# Patient Record
Sex: Male | Born: 1935 | Race: White | Hispanic: No | Marital: Single | Smoking: Never smoker
Health system: Southern US, Community
[De-identification: ages and names within clinical notes are randomized; demographics above are authoritative.]

## PROBLEM LIST (undated history)

## (undated) DIAGNOSIS — F0390 Unspecified dementia without behavioral disturbance: Secondary | ICD-10-CM

## (undated) DIAGNOSIS — N390 Urinary tract infection, site not specified: Secondary | ICD-10-CM

## (undated) DIAGNOSIS — E079 Disorder of thyroid, unspecified: Secondary | ICD-10-CM

## (undated) DIAGNOSIS — E039 Hypothyroidism, unspecified: Secondary | ICD-10-CM

## (undated) DIAGNOSIS — I10 Essential (primary) hypertension: Secondary | ICD-10-CM

## (undated) DIAGNOSIS — I639 Cerebral infarction, unspecified: Secondary | ICD-10-CM

## (undated) DIAGNOSIS — M109 Gout, unspecified: Secondary | ICD-10-CM

## (undated) HISTORY — PX: EYE SURGERY: SHX253

## (undated) HISTORY — PX: COLON SURGERY: SHX602

## (undated) HISTORY — PX: SHOULDER SURGERY: SHX246

---

## 1998-05-04 ENCOUNTER — Ambulatory Visit (HOSPITAL_COMMUNITY): Admission: RE | Admit: 1998-05-04 | Discharge: 1998-05-04 | Payer: Self-pay | Admitting: Internal Medicine

## 1998-06-13 ENCOUNTER — Ambulatory Visit (HOSPITAL_COMMUNITY): Admission: RE | Admit: 1998-06-13 | Discharge: 1998-06-13 | Payer: Self-pay | Admitting: Gastroenterology

## 1998-06-30 ENCOUNTER — Inpatient Hospital Stay (HOSPITAL_COMMUNITY): Admission: RE | Admit: 1998-06-30 | Discharge: 1998-07-06 | Payer: Self-pay | Admitting: Surgery

## 1998-06-30 ENCOUNTER — Encounter: Payer: Self-pay | Admitting: Surgery

## 1998-07-03 ENCOUNTER — Encounter: Payer: Self-pay | Admitting: Surgery

## 1999-07-11 ENCOUNTER — Encounter: Payer: Self-pay | Admitting: Surgery

## 1999-07-16 ENCOUNTER — Encounter (INDEPENDENT_AMBULATORY_CARE_PROVIDER_SITE_OTHER): Payer: Self-pay

## 1999-07-16 ENCOUNTER — Inpatient Hospital Stay (HOSPITAL_COMMUNITY): Admission: RE | Admit: 1999-07-16 | Discharge: 1999-07-19 | Payer: Self-pay | Admitting: Surgery

## 1999-09-05 ENCOUNTER — Encounter: Payer: Self-pay | Admitting: Internal Medicine

## 1999-09-05 ENCOUNTER — Encounter: Admission: RE | Admit: 1999-09-05 | Discharge: 1999-09-05 | Payer: Self-pay | Admitting: Internal Medicine

## 2000-02-20 ENCOUNTER — Ambulatory Visit (HOSPITAL_COMMUNITY): Admission: RE | Admit: 2000-02-20 | Discharge: 2000-02-20 | Payer: Self-pay | Admitting: Gastroenterology

## 2000-02-20 ENCOUNTER — Encounter (INDEPENDENT_AMBULATORY_CARE_PROVIDER_SITE_OTHER): Payer: Self-pay | Admitting: Specialist

## 2001-03-24 ENCOUNTER — Encounter: Admission: RE | Admit: 2001-03-24 | Discharge: 2001-03-24 | Payer: Self-pay | Admitting: *Deleted

## 2001-03-24 ENCOUNTER — Encounter: Payer: Self-pay | Admitting: *Deleted

## 2001-03-26 ENCOUNTER — Ambulatory Visit (HOSPITAL_BASED_OUTPATIENT_CLINIC_OR_DEPARTMENT_OTHER): Admission: RE | Admit: 2001-03-26 | Discharge: 2001-03-27 | Payer: Self-pay | Admitting: *Deleted

## 2001-03-26 ENCOUNTER — Encounter (INDEPENDENT_AMBULATORY_CARE_PROVIDER_SITE_OTHER): Payer: Self-pay | Admitting: Specialist

## 2002-12-30 ENCOUNTER — Encounter: Admission: RE | Admit: 2002-12-30 | Discharge: 2002-12-30 | Payer: Self-pay | Admitting: Internal Medicine

## 2002-12-30 ENCOUNTER — Encounter: Payer: Self-pay | Admitting: Internal Medicine

## 2003-04-04 ENCOUNTER — Ambulatory Visit (HOSPITAL_COMMUNITY): Admission: RE | Admit: 2003-04-04 | Discharge: 2003-04-04 | Payer: Self-pay | Admitting: Gastroenterology

## 2003-04-04 ENCOUNTER — Encounter (INDEPENDENT_AMBULATORY_CARE_PROVIDER_SITE_OTHER): Payer: Self-pay | Admitting: Specialist

## 2004-09-18 ENCOUNTER — Encounter: Admission: RE | Admit: 2004-09-18 | Discharge: 2004-09-18 | Payer: Self-pay | Admitting: Internal Medicine

## 2006-08-13 ENCOUNTER — Encounter: Admission: RE | Admit: 2006-08-13 | Discharge: 2006-08-13 | Payer: Self-pay | Admitting: Internal Medicine

## 2008-05-18 ENCOUNTER — Encounter: Admission: RE | Admit: 2008-05-18 | Discharge: 2008-05-18 | Payer: Self-pay | Admitting: Neurology

## 2010-07-18 ENCOUNTER — Other Ambulatory Visit: Payer: Self-pay | Admitting: Gastroenterology

## 2010-10-05 ENCOUNTER — Emergency Department (HOSPITAL_COMMUNITY): Payer: Medicare Other

## 2010-10-05 ENCOUNTER — Emergency Department (HOSPITAL_COMMUNITY)
Admission: EM | Admit: 2010-10-05 | Discharge: 2010-10-06 | Disposition: A | Discharge status: Home / Self Care | Payer: Medicare Other | Attending: Emergency Medicine | Admitting: Emergency Medicine

## 2010-10-05 DIAGNOSIS — W1789XA Other fall from one level to another, initial encounter: Secondary | ICD-10-CM | POA: Insufficient documentation

## 2010-10-05 DIAGNOSIS — E039 Hypothyroidism, unspecified: Secondary | ICD-10-CM | POA: Insufficient documentation

## 2010-10-05 DIAGNOSIS — S42253A Displaced fracture of greater tuberosity of unspecified humerus, initial encounter for closed fracture: Secondary | ICD-10-CM | POA: Insufficient documentation

## 2010-10-05 DIAGNOSIS — Y92009 Unspecified place in unspecified non-institutional (private) residence as the place of occurrence of the external cause: Secondary | ICD-10-CM | POA: Insufficient documentation

## 2010-10-05 DIAGNOSIS — E119 Type 2 diabetes mellitus without complications: Secondary | ICD-10-CM | POA: Insufficient documentation

## 2010-10-05 DIAGNOSIS — M109 Gout, unspecified: Secondary | ICD-10-CM | POA: Insufficient documentation

## 2010-10-05 DIAGNOSIS — I10 Essential (primary) hypertension: Secondary | ICD-10-CM | POA: Insufficient documentation

## 2010-10-05 DIAGNOSIS — Y998 Other external cause status: Secondary | ICD-10-CM | POA: Insufficient documentation

## 2010-10-05 DIAGNOSIS — Z79899 Other long term (current) drug therapy: Secondary | ICD-10-CM | POA: Insufficient documentation

## 2010-10-19 NOTE — Op Note (Signed)
NAME:  Donald Mcconnell, Donald Mcconnell                           ACCOUNT NO.:  [id number]   MEDICAL RECORD NO.:  [id number]                   PATIENT TYPE:  AMB   LOCATION:  ENDO                                 FACILITY:  [facility]   PHYSICIAN:  Petra Kuba, M.D.                 DATE OF BIRTH:  1928-10-16   DATE OF PROCEDURE:  04/04/2003  DATE OF DISCHARGE:                                 OPERATIVE REPORT   PROCEDURE:  Colonoscopy.   ENDOSCOPIST:  Petra Kuba, M.D.   INDICATION:  Patient with colon polyps; one was a large villous adenoma  requiring a low anterior resection, due for a repeat screening.   INFORMED CONSENT:  Consent was signed after risks, benefits, methods and  options thoroughly discussed in the office on multiple occasions.   MEDICATIONS USED:  Demerol 50 mg, Versed 5 mg.   PROCEDURE:  Rectal inspection was pertinent for external hemorrhoids.  No  obvious rash was seen.  A video pediatric adjustable colonoscope was  inserted, easily advanced around the anastomosis into the cecum; no obvious  abnormality was seen on insertion.  No abdominal pressure or position  changes were required.  The cecum was identified by the appendiceal orifice  and the ileocecal valve.  Scope was slowly withdrawn.  The prep was  adequate.  There was some liquid stool that required washing and suctioning.  On slow withdrawal through the colon, cecum, ascending, transverse and  descending were normal.  Along the anastomosis, back in the proximal rectum,  possibly a tiny polyp was seen and was cold-biopsied x4.  Retroflexion and  anorectal pull-through confirmed some small hemorrhoids.  The scope was  reinserted and at the proximal level of the splenic flexure, a tiny polyp  was seen and was cold-biopsied x2, put in a separate container.  Scope was  further advanced up the right side of the colon, air was suctioned and scope  removed.  The patient tolerated the procedure well.  There were no obvious  immediate complications.   ENDOSCOPIC DIAGNOSES:  1. Internal and external hemorrhoids.  2. Low anterior resection, status post biopsy of a questionable tiny polyp.  3. Questionable descending polyp, cold-biopsied as well.  4. Otherwise within normal limits to the cecum.    PLAN:  Await pathology to determine future colonic screening, although the  patient had other medical problems and question whether that is indicated.  Happy to see back p.r.n., otherwise, return care to Dr. Antony Madura  for the customary health care maintenance to include yearly rectals and  guaiacs.                                               Petra Kuba, M.D.    MEM/MEDQ  D:  04/04/2003  T:  04/04/2003  Job:  [id number]   cc:   Antony Madura, M.D.  [address], [address]  [place]  [place] [address]  Fax: [phone]

## 2010-10-19 NOTE — Op Note (Signed)
[name]  Patient:    Donald Mcconnell, Donald Mcconnell Visit Number: [id number] MRN: [id number]          Service Type: DSU Location: [facility] Attending Physician:  Carlena Sax Dictated by:   Veverly Fells. Arletha Grippe, M.D. Proc. Date: 03/26/01 Admit Date:  03/26/2001                             Operative Report  PREOPERATIVE DIAGNOSIS:  Bilateral chronic ethmoid sinusitis, bilateral chronic maxillary sinusitis.  POSTOPERATIVE DIAGNOSIS:  Bilateral chronic ethmoid sinusitis, bilateral chronic maxillary sinusitis.  OPERATION PERFORMED:  Bilateral endoscopic total ethmoidectomies and bilateral endoscopic maxillary antrostomies using the Insta Trak system for stereotactic ____________ navigation.  SURGEON:  Veverly Fells. Arletha Grippe, M.D.  ANESTHESIA:  General endotracheal.  INDICATIONS FOR PROCEDURE:  This 77 year old white male give a long history of persistent sinus infections after recent dental extractions that were performed a few months ago.  He does complain of a lot of periorbital pressure.  Nasal congestion with drainage and foul odor inside his nose.  On fiberoptic endoscopy he was noted to have active infection and was treated with a prolonged course of antibiotic therapy for over three weeks.  Post treatment CT scan of the sinus obtained on January 27, 2001 did show bilaterally chronic maxillary and ethmoid sinusitis.  Based on his history and physical examination and failure of aggressive medical therapy, I have recommended proceeding with the above noted surgical procedure.  I have discussed extensively with him the risks and benefits of the surgery including risks of general anesthesia, infection, bleeding, orbital and CNS injury, need for light nasal packing, normal recovery period to expect after this type of surgery.  I have entertained any questions, answered them appropriately. Informed consent has been obtained and the patient presents for the  above noted procedure.  OPERATIVE FINDINGS:  Mucopurulent material in the ethmoid and maxillary cavities especially involving the right maxillary sinus.  DESCRIPTION OF PROCEDURE:  The patient was brought to the operating room and placed in the supine position.  General endotracheal anesthesia was administered via the anesthesiologist without complications.  The patient was administered 1 gm of Ancef IV x 1 and 10 mg of Decadron IV x 1.  Head of the table was elevated to 30 degrees.  A throat pack was placed in the posterior pharynx using a small baby lap sponge.  Bilateral sphenopalatine blocks were administered via the greater palatine foramen, each with 1.5 cc of a 1% lidocaine solution with 1:100,000 epinephrine.  After this was done, the patients head was then straightened in a standard fashion.  Cotton pledgets soaked in a 4% cocaine solution were placed in both nares, left in place for approximately 5 to 10 minutes and then removed using a 0 degree rigid endoscope, the anterior portion of both middle turbinates and uncinate processes bilaterally were injected with a 1% lidocaine solution with 1:100,000 epinephrine.  Both middle meatuses were packed with cotton pledgets soaked in a 4% cocaine solution which were left in place for approximately five to 10 minutes and then removed.  The Insta Trak head piece was placed on the patients head and was calibrated to the straight suction aspirator per protocol.  This was used throughout the entire case to identify landmarks as the lamina papyracea and skull base which were not violated at any time. First attention was turned to the left nasal chamber.  The uncinate process was back elevated  using the micro ____________ elevator on a rigid 0 degree rigid endoscopic guidance.  The ____________ was performed with a combination of through-cutting forceps and the microdebrider without difficulty.  The natural sinus ostium and maxillary sinus  was identified.  It was enlarged using through-cutting forceps and the microdebrider without difficulty.  The 30 and the 70 degree rigid endoscope were used to identify the conduits of the maxillary sinus.  Thickened mucosa was noticed and mucopurulent material which was suctioned off but no other lesions or masses were noted.  No further biopsies were taken.  Next, under 0 degree rigid endoscopic guidance, anterior posterior ethmoidectomy was performed through an anterior posterior fascia not to violate lamina papyracea or skull base via Insta Trak guidance.  This was performed with a combination of through-cutting forceps and a microdebrider without difficulty.  30 degree rigid telescope was used to identify the frontal recess area.  There was no evidence of any significant disease in this area. Therefore no manipulation was performed.  Next the attention was turned to the right nasal chamber.  Identical procedure was carried out on this side compared to the left side with identical results; however, there was a lot of mucopurulent material involving the right maxillary sinus area which was suctioned with a curved suction catheter through the enlarged ostium.  Both sinus cavities were then reinspected endoscopically.  There was no evidence of any active bleeding.  Kennedy packs soaked in a Bactroban ointment solution were placed in both ethmoid cavities under endoscopic guidance.  Throat pack was removed.  The orogastric tube was placed.  This was used to decompress the stomach contents.  It was then removed without incident.  FLUIDS GIVEN DURING PROCEDURE:  Approximately 1L of crystalloid.  ESTIMATED BLOOD LOSS:  Less than 30 cc.  URINE OUTPUT:  Not measured.  DRAINS:  There were no drains.  The above noted two Kennedy packs were placed.  SPECIMENS:  Sinus contents for culture and sensitivity ad pathology.  The patient tolerated the procedure well without complications, was  extubated in the operating room and transferred to recovery room in stable condition. Sponge, needle and instrument counts were correct at the end of the procedure.  Total duration of the procedure was approximately 1-1/2 hours.  The patient will be admitted for overnight recovery.  Once he is recovered well, he will be sent home on March 27, 2001.  He will be sent home on Augmentin 875 mg p.o. b.i.d. for two weeks and Vicodin #30 with two refills, one or two tablets p.o. q.4h. p.r.n. pain.  He is to have light activity, no heavy lifting or noseblowing for two weeks after surgery.  Both he and his family were given oral and written instructions.  They are to call with any problems with bleeding, fever, vomiting, pain, reaction to medications or any other questions.  He will follow up in our office for pack removal and bilateral endoscopic debridement of sinus cavities on Wednesday October 30, at 2:25 p.m. ictated by:   Veverly Fells. Arletha Grippe, M.D. Attending Physician:  Carlena Sax DD:  03/26/01 TD:  03/27/01 Job: [id number]

## 2010-10-19 NOTE — Op Note (Signed)
[place]  Patient:    Donald Mcconnell, Donald Mcconnell                        MRN: [id number] Proc. Date: 02/20/00 Adm. Date:  [id number] Disc. Date: [id number] Attending:  Nelda Marseille CC:         Velora Heckler, M.D.             Bertram Millard. Dahlstedt, M.D.             Antony Madura, M.D.                           Operative Report  PROCEDURE:  Colonoscopy with polypectomy.  ENDOSCOPIST:  Petra Kuba, M.D.  INDICATIONS:  Patient with a large villous adenoma status post lower anterior resection overdue for colonic screening.  INFORMED CONSENT:  Consent was signed after risk, benefits, methods and options were thoroughly discussed in the office on multiple occasions.  MEDICINES USED:  Demerol 50 mg, Versed 7 mg.  DESCRIPTION OF PROCEDURE:  Rectal inspection is pertinent for small external hemorrhoids.  Digital exam is pertinent for a small hemorrhoid but no mass. The video colonoscope was inserted and the anorectal pulled through because of a knot sensation he had said he felt in the last week, was done on multiple occasions and just hemorrhoids were seen.  The scope was retroflexed which revealed some internal hemorrhoids.  The scope was then advanced to the anastomoses but unfortunately it could not be advanced around the sharp angulation.  We tried multiple torquing and we went ahead and slowly withdrew this scope.  We then tried the pediatric video colonoscope and unfortunately could not advance that through the anastomosis as well so that was removed, and we reinserted a upper video endoscope which was able to be advanced around the sharp angulation.  We were easily able to advance the scope to the level of the ileocecal valve and with some abdominal pressure we were able to advance to the cecal pole which was identified by the appendiceal orifice and the ileocecal valve.  The scope was slowly withdrawn.  The prep was adequate. There was minimal  liquid stool that required washing and suctioning.  On top of the ileocecal valve a possible 1 mm polyp was seen and was cold biopsied x 2.  In the ascending colon a 2-3 mm polyp was seen and was hot biopsied x 2.  The scope was further withdrawn.  Both polyps were put in the same container. Other than a questionable tiny hyperplastic appearing left-sided polyp at the approximate level of the splenic flexure which was cold biopsied x 2.  No additional findings were seen as we slowly withdrew back to the anastomoses. The anastomosis was friable.  There was some heme but no active bleeding at the end of the procedure.  No obvious adenomatous residual tissue was seen. The scope was then slowly withdrawn.  No additional findings were seen.  We did readvance prior to slowly withdrawing back out past the anastomosis.  Air was suctioned, scope removed.  The patient tolerated the procedure well.  There was no obvious immediate complications.  ENDOSCOPIC DIAGNOSIS: 1. Internal/external hemorrhoids. 2. Proximal rectal anastomosis unable to pass either the video colonoscope or    the pediatric colonoscope but able to pass the upper video endoscope. 3. Questionable IC valve polyp and a questionable left-sided polyp both cold  biopsied. 4. An ascending small polyp status post hot biopsy. 5. Otherwise within normal limits to the cecum.  PLAN:  Await pathology but probably recheck colon screening in 3 years.  May want to consider restarting the procedure with just the upper endoscope.  If his knot on his rectum continues and the analpram-hc cream with 2.5% hydrocortisone is unsuccessful ask him to call Dr. Gerrit Friends in a week or two if it continues to bother him, otherwise return care to Dr. Su Hilt and Dr. Retta Diones for the customary health care maintenance and be happy to see back sooner p.r.n.. DD:  02/20/00 TD:  02/22/00 Job: [id number]

## 2011-01-02 ENCOUNTER — Inpatient Hospital Stay (HOSPITAL_COMMUNITY)
Admission: EM | Admit: 2011-01-02 | Discharge: 2011-01-04 | DRG: 392 | Disposition: A | Discharge status: Home / Self Care | Payer: Medicare Other | Attending: Internal Medicine | Admitting: Internal Medicine

## 2011-01-02 ENCOUNTER — Emergency Department (HOSPITAL_COMMUNITY): Payer: Medicare Other

## 2011-01-02 DIAGNOSIS — E876 Hypokalemia: Secondary | ICD-10-CM | POA: Diagnosis present

## 2011-01-02 DIAGNOSIS — E039 Hypothyroidism, unspecified: Secondary | ICD-10-CM | POA: Diagnosis present

## 2011-01-02 DIAGNOSIS — E860 Dehydration: Secondary | ICD-10-CM | POA: Diagnosis present

## 2011-01-02 DIAGNOSIS — Z79899 Other long term (current) drug therapy: Secondary | ICD-10-CM

## 2011-01-02 DIAGNOSIS — M109 Gout, unspecified: Secondary | ICD-10-CM | POA: Diagnosis present

## 2011-01-02 DIAGNOSIS — N400 Enlarged prostate without lower urinary tract symptoms: Secondary | ICD-10-CM | POA: Diagnosis present

## 2011-01-02 DIAGNOSIS — N179 Acute kidney failure, unspecified: Secondary | ICD-10-CM | POA: Diagnosis present

## 2011-01-02 DIAGNOSIS — A088 Other specified intestinal infections: Principal | ICD-10-CM | POA: Diagnosis present

## 2011-01-02 DIAGNOSIS — I10 Essential (primary) hypertension: Secondary | ICD-10-CM | POA: Diagnosis present

## 2011-01-02 LAB — COMPREHENSIVE METABOLIC PANEL
Alkaline Phosphatase: 59 U/L (ref 39–117)
BUN: 27 mg/dL — ABNORMAL HIGH (ref 6–23)
Creatinine, Ser: 2.55 mg/dL — ABNORMAL HIGH (ref 0.50–1.35)
GFR calc Af Amer: 30 mL/min — ABNORMAL LOW (ref >60)
Glucose, Bld: 107 mg/dL — ABNORMAL HIGH (ref 70–99)
Potassium: 3.1 mEq/L — ABNORMAL LOW (ref 3.5–5.1)
Total Protein: 7 g/dL (ref 6.0–8.3)

## 2011-01-02 LAB — DIFFERENTIAL
Lymphocytes Relative: 26 % (ref 12–46)
Lymphs Abs: 2.5 10*3/uL (ref 0.7–4.0)
Monocytes Absolute: 1.4 10*3/uL — ABNORMAL HIGH (ref 0.1–1.0)
Monocytes Relative: 15 % — ABNORMAL HIGH (ref 3–12)
Neutro Abs: 5.6 10*3/uL (ref 1.7–7.7)

## 2011-01-02 LAB — CBC
HCT: 40 % (ref 39.0–52.0)
Hemoglobin: 14.4 g/dL (ref 13.0–17.0)
MCH: 32 pg (ref 26.0–34.0)
MCHC: 36 g/dL (ref 30.0–36.0)
MCV: 88.9 fL (ref 78.0–100.0)

## 2011-01-02 LAB — TROPONIN I: Troponin I: <0.3 ng/mL (ref <0.30)

## 2011-01-02 MED ORDER — XENON XE 133 GAS
10.0000 | GAS_FOR_INHALATION | Freq: Once | RESPIRATORY_TRACT | Status: AC | PRN
  Administered 2011-01-02: 10.2 via RESPIRATORY_TRACT

## 2011-01-02 MED ORDER — TECHNETIUM TO 99M ALBUMIN AGGREGATED
6.0000 | Freq: Once | INTRAVENOUS | Status: AC | PRN
  Administered 2011-01-02: 6.3 via INTRAVENOUS

## 2011-01-03 LAB — DIFFERENTIAL
Band Neutrophils: 0 % (ref 0–10)
Blasts: 0 %
Lymphocytes Relative: 25 % (ref 12–46)
Lymphs Abs: 2.1 10*3/uL (ref 0.7–4.0)
Metamyelocytes Relative: 0 %
Promyelocytes Absolute: 0 %

## 2011-01-03 LAB — COMPREHENSIVE METABOLIC PANEL
Alkaline Phosphatase: 52 U/L (ref 39–117)
BUN: 30 mg/dL — ABNORMAL HIGH (ref 6–23)
CO2: 26 mEq/L (ref 19–32)
Chloride: 104 mEq/L (ref 96–112)
GFR calc Af Amer: 35 mL/min — ABNORMAL LOW (ref >60)
Glucose, Bld: 95 mg/dL (ref 70–99)
Potassium: 3 mEq/L — ABNORMAL LOW (ref 3.5–5.1)
Total Bilirubin: 0.5 mg/dL (ref 0.3–1.2)

## 2011-01-03 LAB — CBC
HCT: 35.4 % — ABNORMAL LOW (ref 39.0–52.0)
Hemoglobin: 12.4 g/dL — ABNORMAL LOW (ref 13.0–17.0)
WBC: 8.4 10*3/uL (ref 4.0–10.5)

## 2011-01-03 LAB — MAGNESIUM: Magnesium: 1.8 mg/dL (ref 1.5–2.5)

## 2011-01-03 NOTE — H&P (Signed)
Donald Mcconnell, CREQUE NO.:  [id number]  MEDICAL RECORD NO.:  [id number]  LOCATION:  [facility]                         FACILITY:  [facility]  PHYSICIAN:  Talmage Nap, MD  DATE OF BIRTH:  1929/01/09  DATE OF ADMISSION:  01/02/2011 DATE OF DISCHARGE:                             HISTORY & PHYSICAL   PRIMARY CARE PHYSICIAN:  Donald Madura, MD.  HISTORY:  Obtainable from the patient and the patient's son.  CHIEF COMPLAINT:  Diarrhea on and off for about 3 days duration.  HISTORY OF PRESENT ILLNESS:  The patient is an 77 year old Caucasian male with history of hypertension and BPH presenting to the emergency room with diarrhea, which have been on and off for about 3 days duration and this got worse 24 hours prior to presenting to the emergency room. The patient claimed that 3 days prior to presenting to the emergency, he had been in stable health and suddenly developed diarrhea.  The diarrhea was not associated with any food substances.  He claimed he moved his bowels several times.  There was no associated abdominal pain.  He denied any fever.  He denied any chills.  He denied any rigor.  Diarrhea initially stopped and restarted again with multiple episode of loose stools.  He gave no associated systemic symptoms.  He denied any vomiting.  He claimed he was getting progressively weak and subsequently presented to his primary care physician's office.  In PCP's office, he was found to have a low blood pressure and thereafter asked to come to the emergency room to be evaluated.  At the time the patient was seen in the emergency room, blood pressure was stable, he was however dehydrated and had low potassium.  He was advised to be admitted for stabilization.  PAST MEDICAL HISTORY: 1. Positive for hypertension. 2. Gout. 3. Hypothyroidism. 4. BPH.  PAST SURGICAL HISTORY:  No known past surgical history.  MEDICATIONS:  Preadmission meds include; 1. Cardura 4 mg  p.o. daily. 2. Tylenol 60 mg p.o. daily. 3. Levothyroxine 100 mcg p.o. daily. 4. Allopurinol 300 mg p.o. daily./ 5. Flomax 0.4 mg p.o. daily.  ALLERGIES:  No known allergies.  SOCIAL HISTORY:  Negative for alcohol or tobacco use.  Lives at home with his spouse.  FAMILY HISTORY:  Positive for hypertension.  REVIEW OF SYSTEMS:  The patient denies any history of headaches.  No blurred vision.  No nausea or vomiting.  No fever.  No chills.  No rigor.  Complained of dryness in the mouth.  No chest pain.  No shortness of breath.  No cough or abdominal discomfort.  Complained of on and off intermittent nonbloody diarrhea with no associated systemic symptoms.  No hematochezia.  No dysuria or hematuria.  No swelling of the lower extremity.  No intolerance to heat or cold and no neuropsychiatric disorder.  PHYSICAL EXAMINATION:  GENERAL:  Elderly man dehydrated, not in any obvious respiratory distress. VITAL SIGNS:  Blood pressure is 111/69; pulse is 64; respiratory rate is 16; temperature is 97.0. HEENT:  Pupils are reactive to light and extraocular muscles are intact. NECK:  No jugular venous distention.  No carotid bruit.  No lymphadenopathy.  CHEST:  Clear to auscultation. HEART:  Sounds are one and two. ABDOMEN:  Soft, nontender.  Liver and spleen tip not palpable.  Bowel sounds are positive. EXTREMITIES:  No pedal edema. NEUROLOGIC:  Nonfocal. MUSCULOSKELETAL:  Unremarkable. SKIN:  Decreased turgor.  LABORATORY DATA:  Chemistry shows sodium of 139, potassium of 3.1, chloride of 99 with a bicarb of 25, glucose is 107, BUN is 27, creatinine is 2.55, calcium is 9.4.  LFT normal.  Hematological indices showed WBC of 9.4, hemoglobin of 14.4, hematocrit of 40.0, MCV of 88.9 with a platelet count of 184.  Monocyte is 16 and absolute monocyte count is 1.4.  D-dimer 1.56, indication for this been ordered by the emergency physician is unknown to me.  First set of cardiac markers troponin  I  less than 0.30.  IMAGING STUDIES:  Done on the patient include VQ scan, which showed low probability for pulmonary embolism.  ADMITTING IMPRESSION: 1. Diarrhea, most likely viral in origin. 2. Dehydration. 3. Acute renal failure (most likely prerenal). 4. Hypokalemia. 5. Hypertension. 6. Hypothyroidism. 7. Gout. 8. Benign prostatic hypertrophy.  PLAN:  Admit, the patient to general medical floor.  The patient will be adequately rehydrated with normal saline with 20 mEq of KCl to go at a rate of 120 mL an hour.  Blood pressure will be maintained with  25 mg p.o. daily.  He will be on Cardura and Flomax for his BPH and Synthroid 100 mcg p.o. daily for hypothyroidism and allopurinol 200 mg p.o. daily for gout.  GI prophylaxis will be with Protonix 40 mg p.o. daily and SCDs boots or TED stockings for DVT prophylaxis.Further workup to be done on this patient will include repeating CBC, CMP and magnesium in a.m., and if the patient is clinically stable, most likely will be discharged home. He will be followed and evaluated on day-to-day basis.     Talmage Nap, MD     CN/MEDQ  D:  01/02/2011  T:  01/03/2011  Job:  [id number]  Electronically Signed by Talmage Nap  on 01/03/2011 12:57:06 AM

## 2011-01-04 LAB — BASIC METABOLIC PANEL
BUN: 22 mg/dL (ref 6–23)
Creatinine, Ser: 1.56 mg/dL — ABNORMAL HIGH (ref 0.50–1.35)
GFR calc Af Amer: 52 mL/min — ABNORMAL LOW (ref >60)
GFR calc non Af Amer: 43 mL/min — ABNORMAL LOW (ref >60)

## 2011-01-15 NOTE — Discharge Summary (Signed)
  [name], KEEP NO.:  [id number]  MEDICAL RECORD NO.:  [id number]  LOCATION:  5530                         FACILITY:  [facility]  PHYSICIAN:  Lonia Blood, M.D.       DATE OF BIRTH:  [birth year]  DATE OF ADMISSION:  01/02/2011 DATE OF DISCHARGE:  01/04/2011                              DISCHARGE SUMMARY   PRIMARY CARE PHYSICIAN:  Antony Madura, MD  DISCHARGE DIAGNOSES: 1. Diarrhea - Clostridium difficile negative - probably viral     gastroenteritis - improved. 2. Acute renal insufficiency due to dehydration and the medications -     improved. 3. History of benign prostatic hypertrophy without evidence of urinary     retention. 4. Gout. 5. Hypothyroidism. 6. Hypertension.  DISCHARGE MEDICATIONS: 1. Lomotil 5 mL by mouth 4 times a day as needed for diarrhea. 2. Atenolol 50 mg daily. 3. Allopurinol 300 mg daily. 4. Levothyroxine 100 mcg daily.  CONDITION ON DISCHARGE:  Mr. Donald Mcconnell was discharged in good condition. Afebrile, temperature 98.6, pulse 66, respiratory rate 18, blood pressure 119/66, saturation 96% on room air.  He will follow up closely with his primary care physician, Dr. Burton Apley for followup his base metabolic profile.  Discharge creatinine was 1.5 and conservation of resumption of his other antihypertensives if the blood pressure is rising.  CONSULTATION:  No consultation obtained.  PROCEDURE:  The patient underwent Clostridium difficile PCR testing which was negative.  The patient also underwent ventilation-perfusion scan which was low probability for PE.  HISTORY AND PHYSICAL:  Refer dictated H and P done by Dr. Beverly Gust on August 1 712.  HOSPITAL COURSE:  Mr. Sackmann is an 77 year old gentleman with history of hypertension and BPH, presented to emergency room with increased weakness and incessant diarrhea.  He was fine in the emergency room and had acute renal insufficiency with a creatinine of 2.5, BUN 27 and  also hypokalemia with potassium of 3.1.  The patient was placed on intravenous fluids and his HCTZ was discontinued.  His stool was tested for C. diff by PCR.  By hospital day #2, after the patient started Lomotil, his diarrhea was improved.  His acute renal failure and hypokalemia improved too.  His potassium level at the time of discharge being 3.5, creatinine at the time of discharge was 1.5.  The patient was resumed on atenolol only, told to stop HCTZ and Norvasc for now, and he will follow up closely with his primary care physician, Dr. Burton Apley.     Lonia Blood, M.D.     SL/MEDQ  D:  01/05/2011  T:  01/05/2011  Job:  [id number]  cc:   Antony Madura, M.D.  Electronically Signed by Lonia Blood M.D. on 01/15/2011 05:38:08 PM

## 2011-09-10 ENCOUNTER — Other Ambulatory Visit: Payer: Self-pay | Admitting: Internal Medicine

## 2011-09-10 ENCOUNTER — Ambulatory Visit
Admission: RE | Admit: 2011-09-10 | Discharge: 2011-09-10 | Disposition: A | Discharge status: Home / Self Care | Payer: Medicare Other | Source: Ambulatory Visit | Attending: Internal Medicine | Admitting: Internal Medicine

## 2011-09-10 DIAGNOSIS — M25551 Pain in right hip: Secondary | ICD-10-CM

## 2011-09-26 ENCOUNTER — Other Ambulatory Visit (HOSPITAL_COMMUNITY): Payer: Medicare Other

## 2011-10-01 ENCOUNTER — Ambulatory Visit: Admit: 2011-10-01 | Payer: Self-pay | Admitting: Ophthalmology

## 2011-10-01 SURGERY — PHACOEMULSIFICATION, CATARACT, WITH IOL INSERTION
Anesthesia: Monitor Anesthesia Care | Laterality: Left

## 2013-02-13 ENCOUNTER — Emergency Department (HOSPITAL_COMMUNITY): Payer: Medicare Other

## 2013-02-13 ENCOUNTER — Inpatient Hospital Stay (HOSPITAL_COMMUNITY)
Admission: EM | Admit: 2013-02-13 | Discharge: 2013-02-16 | DRG: 065 | Disposition: A | Discharge status: Home / Self Care | Payer: Medicare Other | Attending: Internal Medicine | Admitting: Internal Medicine

## 2013-02-13 ENCOUNTER — Inpatient Hospital Stay (HOSPITAL_COMMUNITY): Payer: Medicare Other

## 2013-02-13 ENCOUNTER — Encounter (HOSPITAL_COMMUNITY): Payer: Self-pay | Admitting: *Deleted

## 2013-02-13 DIAGNOSIS — G8330 Monoplegia, unspecified affecting unspecified side: Secondary | ICD-10-CM | POA: Diagnosis present

## 2013-02-13 DIAGNOSIS — I6350 Cerebral infarction due to unspecified occlusion or stenosis of unspecified cerebral artery: Secondary | ICD-10-CM

## 2013-02-13 DIAGNOSIS — Z8042 Family history of malignant neoplasm of prostate: Secondary | ICD-10-CM

## 2013-02-13 DIAGNOSIS — G309 Alzheimer's disease, unspecified: Secondary | ICD-10-CM | POA: Diagnosis present

## 2013-02-13 DIAGNOSIS — Z8739 Personal history of other diseases of the musculoskeletal system and connective tissue: Secondary | ICD-10-CM | POA: Diagnosis present

## 2013-02-13 DIAGNOSIS — N183 Chronic kidney disease, stage 3 unspecified: Secondary | ICD-10-CM | POA: Diagnosis present

## 2013-02-13 DIAGNOSIS — I639 Cerebral infarction, unspecified: Secondary | ICD-10-CM | POA: Diagnosis present

## 2013-02-13 DIAGNOSIS — I129 Hypertensive chronic kidney disease with stage 1 through stage 4 chronic kidney disease, or unspecified chronic kidney disease: Secondary | ICD-10-CM | POA: Diagnosis present

## 2013-02-13 DIAGNOSIS — R471 Dysarthria and anarthria: Secondary | ICD-10-CM | POA: Diagnosis present

## 2013-02-13 DIAGNOSIS — M109 Gout, unspecified: Secondary | ICD-10-CM | POA: Diagnosis present

## 2013-02-13 DIAGNOSIS — F40298 Other specified phobia: Secondary | ICD-10-CM | POA: Diagnosis present

## 2013-02-13 DIAGNOSIS — R29810 Facial weakness: Secondary | ICD-10-CM | POA: Diagnosis present

## 2013-02-13 DIAGNOSIS — E039 Hypothyroidism, unspecified: Secondary | ICD-10-CM | POA: Diagnosis present

## 2013-02-13 DIAGNOSIS — F0280 Dementia in other diseases classified elsewhere without behavioral disturbance: Secondary | ICD-10-CM | POA: Diagnosis present

## 2013-02-13 DIAGNOSIS — I10 Essential (primary) hypertension: Secondary | ICD-10-CM | POA: Diagnosis present

## 2013-02-13 DIAGNOSIS — Q2111 Secundum atrial septal defect: Secondary | ICD-10-CM

## 2013-02-13 DIAGNOSIS — Q211 Atrial septal defect: Secondary | ICD-10-CM

## 2013-02-13 DIAGNOSIS — I6340 Cerebral infarction due to embolism of unspecified cerebral artery: Principal | ICD-10-CM | POA: Diagnosis present

## 2013-02-13 DIAGNOSIS — N19 Unspecified kidney failure: Secondary | ICD-10-CM | POA: Diagnosis present

## 2013-02-13 HISTORY — DX: Disorder of thyroid, unspecified: E07.9

## 2013-02-13 HISTORY — DX: Essential (primary) hypertension: I10

## 2013-02-13 HISTORY — DX: Gout, unspecified: M10.9

## 2013-02-13 HISTORY — DX: Hypothyroidism, unspecified: E03.9

## 2013-02-13 LAB — CBC
HCT: 40.4 % (ref 39.0–52.0)
Hemoglobin: 14 g/dL (ref 13.0–17.0)
MCH: 32.4 pg (ref 26.0–34.0)
MCHC: 34.7 g/dL (ref 30.0–36.0)
MCV: 93.5 fL (ref 78.0–100.0)
RDW: 14.4 % (ref 11.5–15.5)

## 2013-02-13 LAB — COMPREHENSIVE METABOLIC PANEL
Albumin: 3.8 g/dL (ref 3.5–5.2)
BUN: 16 mg/dL (ref 6–23)
Calcium: 9.5 mg/dL (ref 8.4–10.5)
Chloride: 106 mEq/L (ref 96–112)
Creatinine, Ser: 1.71 mg/dL — ABNORMAL HIGH (ref 0.50–1.35)
Total Bilirubin: 0.5 mg/dL (ref 0.3–1.2)

## 2013-02-13 LAB — TROPONIN I: Troponin I: <0.3 ng/mL (ref <0.30)

## 2013-02-13 LAB — RAPID URINE DRUG SCREEN, HOSP PERFORMED
Amphetamines: NOT DETECTED
Cocaine: NOT DETECTED
Opiates: NOT DETECTED

## 2013-02-13 LAB — GLUCOSE, CAPILLARY: Glucose-Capillary: 95 mg/dL (ref 70–99)

## 2013-02-13 LAB — POCT I-STAT, CHEM 8
BUN: 16 mg/dL (ref 6–23)
Creatinine, Ser: 1.7 mg/dL — ABNORMAL HIGH (ref 0.50–1.35)
Potassium: 3.9 mEq/L (ref 3.5–5.1)
Sodium: 144 mEq/L (ref 135–145)

## 2013-02-13 LAB — URINALYSIS, ROUTINE W REFLEX MICROSCOPIC
Glucose, UA: NEGATIVE mg/dL
Hgb urine dipstick: NEGATIVE
Protein, ur: NEGATIVE mg/dL
Specific Gravity, Urine: 1.005 (ref 1.005–1.030)

## 2013-02-13 LAB — DIFFERENTIAL
Basophils Relative: 1 % (ref 0–1)
Eosinophils Absolute: 0.2 10*3/uL (ref 0.0–0.7)
Eosinophils Relative: 2 % (ref 0–5)
Monocytes Absolute: 0.9 10*3/uL (ref 0.1–1.0)
Monocytes Relative: 10 % (ref 3–12)

## 2013-02-13 LAB — POCT I-STAT TROPONIN I

## 2013-02-13 LAB — ETHANOL: Alcohol, Ethyl (B): <11 mg/dL (ref 0–11)

## 2013-02-13 MED ORDER — ASPIRIN EC 81 MG PO TBEC
81.0000 mg | DELAYED_RELEASE_TABLET | Freq: Every day | ORAL | Status: DC
  Administered 2013-02-14 – 2013-02-16 (×3): 81 mg via ORAL
  Filled 2013-02-13 (×3): qty 1

## 2013-02-13 MED ORDER — HYDRALAZINE HCL 20 MG/ML IJ SOLN: 10.0000 mg | INTRAMUSCULAR | Status: DC | PRN

## 2013-02-13 MED ORDER — ENOXAPARIN SODIUM 40 MG/0.4ML ~~LOC~~ SOLN
40.0000 mg | SUBCUTANEOUS | Status: DC
  Administered 2013-02-13 – 2013-02-15 (×3): 40 mg via SUBCUTANEOUS
  Filled 2013-02-13 (×4): qty 0.4

## 2013-02-13 MED ORDER — ALLOPURINOL 300 MG PO TABS
300.0000 mg | ORAL_TABLET | Freq: Every day | ORAL | Status: DC
  Filled 2013-02-13: qty 1

## 2013-02-13 MED ORDER — ATENOLOL 50 MG PO TABS
50.0000 mg | ORAL_TABLET | Freq: Every day | ORAL | Status: DC
  Administered 2013-02-14 – 2013-02-16 (×3): 50 mg via ORAL
  Filled 2013-02-13 (×3): qty 1

## 2013-02-13 MED ORDER — LEVOTHYROXINE SODIUM 100 MCG PO TABS
100.0000 ug | ORAL_TABLET | Freq: Every day | ORAL | Status: DC
  Administered 2013-02-14 – 2013-02-15 (×2): 100 ug via ORAL
  Filled 2013-02-13 (×4): qty 1

## 2013-02-13 MED ORDER — LORAZEPAM 2 MG/ML IJ SOLN
1.0000 mg | Freq: Once | INTRAMUSCULAR | Status: AC
  Administered 2013-02-13: 1 mg via INTRAVENOUS
  Filled 2013-02-13: qty 1

## 2013-02-13 MED ORDER — SODIUM CHLORIDE 0.9 % IV SOLN
INTRAVENOUS | Status: DC
  Administered 2013-02-13: 23:00:00 via INTRAVENOUS

## 2013-02-13 MED ORDER — INFLUENZA VAC SPLIT QUAD 0.5 ML IM SUSP
0.5000 mL | INTRAMUSCULAR | Status: AC
  Administered 2013-02-14: 0.5 mL via INTRAMUSCULAR
  Filled 2013-02-13 (×2): qty 0.5

## 2013-02-13 MED ORDER — SENNOSIDES-DOCUSATE SODIUM 8.6-50 MG PO TABS: 1.0000 | ORAL_TABLET | Freq: Every evening | ORAL | Status: DC | PRN

## 2013-02-13 MED ORDER — SODIUM CHLORIDE 0.9 % IV SOLN: INTRAVENOUS | Status: AC

## 2013-02-13 NOTE — ED Notes (Signed)
Neurology MD at bedside

## 2013-02-13 NOTE — ED Notes (Signed)
Pt to ED via EMS from [facility] for further evaluation of stroke symptoms.  Symptoms onset around 1pm today, CT completed at [facility].  Pt alert upon arrival to [facility], placed on cardiac monitor- NSR, 2 IV's in place.

## 2013-02-13 NOTE — ED Provider Notes (Signed)
CSN: [id number]     Arrival date & time 02/13/13  1606 History  This chart was scribed for Audree Camel, MD by Henri Medal, ED Scribe. This patient was seen in room APA18/APA18 and the patient's care was started at 4:30 PM.  Chief Complaint  Patient presents with  . facial numbness   . Nasal Congestion   The history is provided by the spouse and the patient. No language interpreter was used.   HPI Comments: KRZYSZTOF REICHELT is a 79 y.o. male who presents to the Emergency Department complaining of sudden left sided face and arm numbness onset 3.5 hours ago with associated left sided facial pain over the past 2 days. Pt reports weakness, difficulty walking and moving his left arm since the onset of symptoms. Pt's wife reports left-sided facial droop and slurred speech since the onset of symptoms. Pt's wife states all symptoms are gradually improving except for left-sided facial pain. Pt states he has some back pain at baseline. Pt denied recent falls. Pt states he is not on any blood thinners. Pt denies a history of stroke, DM, and cholesterol problems. Pt states he has a history of HTN and gout and is complaint with prescribed medications. Pt denies HA or any other symptoms.   Past Medical History  Diagnosis Date  . Hypertension   . Thyroid disease     hypothyroidism  . Gout    Past Surgical History  Procedure Laterality Date  . Colon surgery     History reviewed. No pertinent family history. History  Substance Use Topics  . Smoking status: Never Smoker   . Smokeless tobacco: Not on file  . Alcohol Use: No    Review of Systems  Musculoskeletal: Positive for back pain (baseline).  Neurological: Positive for facial asymmetry (left-sided facial droop), speech difficulty (slurred), weakness and numbness (left-sided). Negative for headaches.  All other systems reviewed and are negative.    Allergies  Review of patient's allergies indicates no known allergies.  Home Medications   No current outpatient prescriptions on file.  Triage Vitals: BP 216/81  Pulse 58  Temp(Src) 98 F (36.7 C) (Oral)  Resp 18  Ht 5\' 8"  (1.727 m)  Wt 178 lb (80.74 kg)  BMI 27.07 kg/m2  SpO2 96%  Physical Exam  Nursing note and vitals reviewed. Constitutional: He appears well-developed and well-nourished. No distress.  HENT:  Head: Normocephalic and atraumatic.  Eyes: EOM are normal. Pupils are equal, round, and reactive to light.  Neck: Neck supple. No tracheal deviation present.  Cardiovascular: Normal rate, regular rhythm and normal heart sounds.   Pulmonary/Chest: Effort normal and breath sounds normal. No respiratory distress.  Abdominal: Soft. There is no tenderness.  Musculoskeletal: Normal range of motion.  Neurological: He is alert. He is disoriented. A cranial nerve deficit is present. No sensory deficit. GCS eye subscore is 4. GCS verbal subscore is 4. GCS motor subscore is 6.  4/5 strength in left upper extremity, including grip strength and biceps. Normal leg strength bilaterally. Decreased strength periorbitally (left) and mild left-sided facial droop.  Skin: Skin is warm and dry.  Psychiatric: He has a normal mood and affect. His behavior is normal.    ED Course  Procedures (including critical care time)  DIAGNOSTIC STUDIES: Oxygen Saturation is 96% on room air, adequate by my interpretation.    COORDINATION OF CARE: 4:40 PM- Discussed suspicion of a stroke with pt and wife. Discussed treatment plan which includes diagnostic lab work and radiology and  pt and wife agrees.    Labs Review Labs Reviewed  COMPREHENSIVE METABOLIC PANEL - Abnormal; Notable for the following:    Glucose, Bld 101 (*)    Creatinine, Ser 1.71 (*)    GFR calc non Af Amer 36 (*)    GFR calc Af Amer 41 (*)    All other components within normal limits  POCT I-STAT, CHEM 8 - Abnormal; Notable for the following:    Creatinine, Ser 1.70 (*)    All other components within normal limits   ETHANOL  PROTIME-INR  APTT  CBC  DIFFERENTIAL  TROPONIN I  GLUCOSE, CAPILLARY  URINE RAPID DRUG SCREEN (HOSP PERFORMED)  URINALYSIS, ROUTINE W REFLEX MICROSCOPIC  POCT I-STAT TROPONIN I    Date: 02/13/2013  Rate: 56  Rhythm: sinus bradycardia  QRS Axis: normal  Intervals: PR prolonged  ST/T Wave abnormalities: normal  Conduction Disutrbances:first-degree A-V block   Narrative Interpretation:   Old EKG Reviewed: changes noted     Imaging Review Ct Head Wo Contrast  02/13/2013   CLINICAL DATA:  79 year old male code stroke with acute left side weakness.  EXAM: CT HEAD WITHOUT CONTRAST  TECHNIQUE: Contiguous axial images were obtained from the base of the skull through the vertex without intravenous contrast.  COMPARISON:  05/18/2008.  FINDINGS: Visualized paranasal sinuses and mastoids are clear. Postoperative changes to the orbits. Negative scalp soft tissues. No acute osseous abnormality identified.  Dominant distal left vertebral artery re- identified. Calcified atherosclerosis at the skull base. Mildly decreased cerebral volume. No ventriculomegaly. No midline shift, mass effect, or evidence of intracranial mass lesion. No acute intracranial hemorrhage. Questionable chronic and unchanged small left anterior frontal extra-axial collection. No suspicious intracranial vascular hyperdensity. No evidence of cortically based acute infarction identified.  IMPRESSION: No acute intracranial abnormality with Stable non contrast CT appearance of the brain except for mild generalized volume loss since 2009.  Study discussed by telephone with Dr. Pricilla Loveless on 02/13/2013 at 16:51 .   Electronically Signed   By: Augusto Gamble M.D.   On: 02/13/2013 16:52     MDM   1. CVA (cerebral vascular accident)    Code stroke all this patient's onset of symptoms a less than 4 hours. CT scan shows no acute bleed. Hypertension slowly improving without intervention. Due the time of onset and the fact the  patient's symptoms seem to be improving the patient is not a TPA candidate. I discussed the case with the neurologist on-call Dr. Cyril Mourning who agrees that he is not a TPA candidate. However due to the window which presented he may still be a procedural candidate and thus we will transfer him to [facility] for evaluation in the ED. Patient's airway breathing and circulation are intact. I will not give medications for blood pressure as he does not have a hemorrhagic stroke.  I personally performed the services described in this documentation, which was scribed in my presence. The recorded information has been reviewed and is accurate.   Audree Camel, MD 02/13/13 [phone]

## 2013-02-13 NOTE — ED Provider Notes (Addendum)
1805- Donald Mcconnell is a 79 y.o. male presents by EMS for evaluation of CVA. He presented to [facility] ED about 2 hours ago with acute left hemiparesis. The weakness started at 1 PM today. He has also been troubled by nasal congestion, and diarrhea, recently.  Exam- He is alert and responsive. He complains of nasal congestion. He seems indifferent to his left sided weakness. He has left hemiparesis. He is not in respiratory distress. Blood pressure on arrival, markedly elevated 207/135.  18:11- discussed the case with Dr. Cyril Mourning, neurologist. He'll see the patient shortly. He does not want to initiate any immediate treatment. I asked the nurse, to do a stroke swallow screen.    Flint Melter, MD 02/13/13 1811  Flint Melter, MD 02/13/13 [phone]

## 2013-02-13 NOTE — Consult Note (Signed)
Referring Physician: ED [facility]    Chief Complaint: code stroke: left arm-face weakness and numbness. HPI:                                                                                                                                         Donald Mcconnell is an 79 y.o. male with a past medical history significant for HTN, MCI, hypothyroidism, gout, who was in his usual state of health until 1 pm today when he developed acute onset left arm-face numbness and weakness. He was driving with his wife in route to [facility] when he complained to his wife that the left side of his face was numb and he couldn't use the left arm properly. They presented to [facility] ED about 3.5 hours after symptoms onset and the code stroke was activated. Urgent CT brain showed no acute abnormality.  NIHSS was not performed at [facility] ED. In any case, when he arrived to [facility] ED he was out of the window for IV thrombolysis, his symptoms were improving, and his SBP>200. Denies associated HA, vertigo, double vision, difficulty swallowing, confusion, or visual disturbances. He speech is dysarthric. He is not on antiplatelet therapy.  Date last known well: 02/13/13 Time last known well: 1 pm tPA Given: no , out of the window. NIHSS: 3 MRS: 2  Past Medical History  Diagnosis Date  . Hypertension   . Thyroid disease     hypothyroidism  . Gout     Past Surgical History  Procedure Laterality Date  . Colon surgery    . Shoulder surgery      History reviewed. No pertinent family history. Social History:  reports that he has never smoked. He does not have any smokeless tobacco history on file. He reports that he does not drink alcohol or use illicit drugs.  Allergies: No Known Allergies  Medications:                                                                                                                           I have reviewed the patient's current medications.  ROS:  History obtained from the patient, family, and chart review.  General ROS: negative for - chills, fatigue, fever, night sweats, weight gain or weight loss Psychological ROS: negative for - behavioral disorder, hallucinations, mood swings or suicidal ideation Ophthalmic ROS: negative for - blurry vision, double vision, eye pain or loss of vision ENT ROS: negative for - epistaxis, nasal discharge, oral lesions, sore throat, tinnitus or vertigo Allergy and Immunology ROS: negative for - hives or itchy/watery eyes Hematological and Lymphatic ROS: negative for - bleeding problems, bruising or swollen lymph nodes Endocrine ROS: negative for - galactorrhea, hair pattern changes, polydipsia/polyuria or temperature intolerance Respiratory ROS: negative for - cough, hemoptysis, shortness of breath or wheezing Cardiovascular ROS: negative for - chest pain, dyspnea on exertion, edema or irregular heartbeat Gastrointestinal ROS: negative for - abdominal pain, diarrhea, hematemesis, nausea/vomiting or stool incontinence Genito-Urinary ROS: negative for - dysuria, hematuria, incontinence or urinary frequency/urgency Musculoskeletal ROS: negative for - joint swelling Neurological ROS: as noted in HPI Dermatological ROS: negative for rash and skin lesion changes     Physical exam: pleasant male in no apparent distress. Blood pressure 203/81, pulse 61, temperature 98.4 F (36.9 C), temperature source Oral, resp. rate 21, height 5\' 8"  (1.727 m), weight 80.74 kg (178 lb), SpO2 100.00%. Head: normocephalic. Neck: supple, no bruits, no JVD. Cardiac: no murmurs. Lungs: clear. Abdomen: soft, no tender, no mass. Extremities: no edema.  Neurologic Examination:                                                                                                      Mental Status: Alert, awake, disoriented to  year-month, thought content appropriate.  Speech fluent without evidence of aphasia.  Able to follow 3 step commands without difficulty. Cranial Nerves: II: Discs flat bilaterally; Visual fields grossly normal, pupils equal, round, reactive to light and accommodation III,IV, VI: ptosis not present, extra-ocular motions intact bilaterally V: facial light touch sensation normal bilaterally VII: mild left lower face weakness. VIII: hearing normal bilaterally IX,X: gag reflex present XI: bilateral shoulder shrug XII: midline tongue extension Motor: Significant for left arm monoparesis. Tone and bulk:normal tone throughout; no atrophy noted Sensory: Pinprick and light touch intact throughout, bilaterally Deep Tendon Reflexes:  2+ all over Plantars: Right: downgoing   Left: downgoing Cerebellar: normal finger-to-nose and normal heel-to-shin test in the right. Can not perform in the left UE due to weakness but normal HKS in the left LE. Gait:  No ataxia. CV: pulses palpable throughout    Results for orders placed during the hospital encounter of 02/13/13 (from the past 48 hour(s))  ETHANOL     Status: None   Collection Time    02/13/13  4:57 PM      Result Value Range   Alcohol, Ethyl (B) <11  0 - 11 mg/dL   Comment:            LOWEST DETECTABLE LIMIT FOR     SERUM ALCOHOL IS 11 mg/dL     FOR MEDICAL PURPOSES ONLY  PROTIME-INR     Status: None   Collection  Time    02/13/13  4:57 PM      Result Value Range   Prothrombin Time 12.6  11.6 - 15.2 seconds   INR 0.96  0.00 - 1.49  APTT     Status: None   Collection Time    02/13/13  4:57 PM      Result Value Range   aPTT 31  24 - 37 seconds  CBC     Status: None   Collection Time    02/13/13  4:57 PM      Result Value Range   WBC 8.6  4.0 - 10.5 K/uL   RBC 4.32  4.22 - 5.81 MIL/uL   Hemoglobin 14.0  13.0 - 17.0 g/dL   HCT 14.7  82.9 - 56.2 %   MCV 93.5  78.0 - 100.0 fL   MCH 32.4  26.0 - 34.0 pg   MCHC 34.7  30.0 - 36.0 g/dL    RDW 13.0  86.5 - 78.4 %   Platelets 197  150 - 400 K/uL  DIFFERENTIAL     Status: None   Collection Time    02/13/13  4:57 PM      Result Value Range   Neutrophils Relative % 51  43 - 77 %   Neutro Abs 4.4  1.7 - 7.7 K/uL   Lymphocytes Relative 36  12 - 46 %   Lymphs Abs 3.1  0.7 - 4.0 K/uL   Monocytes Relative 10  3 - 12 %   Monocytes Absolute 0.9  0.1 - 1.0 K/uL   Eosinophils Relative 2  0 - 5 %   Eosinophils Absolute 0.2  0.0 - 0.7 K/uL   Basophils Relative 1  0 - 1 %   Basophils Absolute 0.1  0.0 - 0.1 K/uL  COMPREHENSIVE METABOLIC PANEL     Status: Abnormal   Collection Time    02/13/13  4:57 PM      Result Value Range   Sodium 141  135 - 145 mEq/L   Potassium 3.9  3.5 - 5.1 mEq/L   Chloride 106  96 - 112 mEq/L   CO2 26  19 - 32 mEq/L   Glucose, Bld 101 (*) 70 - 99 mg/dL   BUN 16  6 - 23 mg/dL   Creatinine, Ser 6.96 (*) 0.50 - 1.35 mg/dL   Calcium 9.5  8.4 - 29.5 mg/dL   Total Protein 7.0  6.0 - 8.3 g/dL   Albumin 3.8  3.5 - 5.2 g/dL   AST 19  0 - 37 U/L   ALT 13  0 - 53 U/L   Alkaline Phosphatase 84  39 - 117 U/L   Total Bilirubin 0.5  0.3 - 1.2 mg/dL   GFR calc non Af Amer 36 (*) >90 mL/min   GFR calc Af Amer 41 (*) >90 mL/min   Comment: (NOTE)     The eGFR has been calculated using the CKD EPI equation.     This calculation has not been validated in all clinical situations.     eGFR's persistently <90 mL/min signify possible Chronic Kidney     Disease.  TROPONIN I     Status: None   Collection Time    02/13/13  4:57 PM      Result Value Range   Troponin I <0.30  <0.30 ng/mL   Comment:            Due to the release kinetics of cTnI,     a negative  result within the first hours     of the onset of symptoms does not rule out     myocardial infarction with certainty.     If myocardial infarction is still suspected,     repeat the test at appropriate intervals.  POCT I-STAT TROPONIN I     Status: None   Collection Time    02/13/13  4:59 PM      Result  Value Range   Troponin i, poc 0.01  0.00 - 0.08 ng/mL   Comment 3            Comment: Due to the release kinetics of cTnI,     a negative result within the first hours     of the onset of symptoms does not rule out     myocardial infarction with certainty.     If myocardial infarction is still suspected,     repeat the test at appropriate intervals.  POCT I-STAT, CHEM 8     Status: Abnormal   Collection Time    02/13/13  5:01 PM      Result Value Range   Sodium 144  135 - 145 mEq/L   Potassium 3.9  3.5 - 5.1 mEq/L   Chloride 108  96 - 112 mEq/L   BUN 16  6 - 23 mg/dL   Creatinine, Ser 1.19 (*) 0.50 - 1.35 mg/dL   Glucose, Bld 96  70 - 99 mg/dL   Calcium, Ion 1.47  8.29 - 1.30 mmol/L   TCO2 24  0 - 100 mmol/L   Hemoglobin 15.0  13.0 - 17.0 g/dL   HCT 56.2  13.0 - 86.5 %  GLUCOSE, CAPILLARY     Status: None   Collection Time    02/13/13  5:43 PM      Result Value Range   Glucose-Capillary 95  70 - 99 mg/dL   Comment 1 Notify RN     Comment 2 Documented in Chart    URINALYSIS, ROUTINE W REFLEX MICROSCOPIC     Status: None   Collection Time    02/13/13  5:51 PM      Result Value Range   Color, Urine YELLOW  YELLOW   APPearance CLEAR  CLEAR   Specific Gravity, Urine 1.005  1.005 - 1.030   pH 5.5  5.0 - 8.0   Glucose, UA NEGATIVE  NEGATIVE mg/dL   Hgb urine dipstick NEGATIVE  NEGATIVE   Bilirubin Urine NEGATIVE  NEGATIVE   Ketones, ur NEGATIVE  NEGATIVE mg/dL   Protein, ur NEGATIVE  NEGATIVE mg/dL   Urobilinogen, UA 0.2  0.0 - 1.0 mg/dL   Nitrite NEGATIVE  NEGATIVE   Leukocytes, UA NEGATIVE  NEGATIVE   Comment: MICROSCOPIC NOT DONE ON URINES WITH NEGATIVE PROTEIN, BLOOD, LEUKOCYTES, NITRITE, OR GLUCOSE <1000 mg/dL.   Ct Head Wo Contrast  02/13/2013   CLINICAL DATA:  79 year old male code stroke with acute left side weakness.  EXAM: CT HEAD WITHOUT CONTRAST  TECHNIQUE: Contiguous axial images were obtained from the base of the skull through the vertex without intravenous  contrast.  COMPARISON:  05/18/2008.  FINDINGS: Visualized paranasal sinuses and mastoids are clear. Postoperative changes to the orbits. Negative scalp soft tissues. No acute osseous abnormality identified.  Dominant distal left vertebral artery re- identified. Calcified atherosclerosis at the skull base. Mildly decreased cerebral volume. No ventriculomegaly. No midline shift, mass effect, or evidence of intracranial mass lesion. No acute intracranial hemorrhage. Questionable chronic and unchanged small left anterior frontal  extra-axial collection. No suspicious intracranial vascular hyperdensity. No evidence of cortically based acute infarction identified.  IMPRESSION: No acute intracranial abnormality with Stable non contrast CT appearance of the brain except for mild generalized volume loss since 2009.  Study discussed by telephone with Dr. Pricilla Loveless on 02/13/2013 at 16:51 .   Electronically Signed   By: Augusto Gamble M.D.   On: 02/13/2013 16:52     Assessment: 79 y.o. male with acute onset left arm monoparesis, left face droop, numbness left face-arm, and dysarthria. NIHSS 3. CT brain unremarkable. Suspect acute infarct, right cortical. By the time was contacted, patient was already out of the window for thrombolysis.  Admit to medicine. Complete stroke work up. Start aspirin 81 mg daily.  Stroke Risk Factors - age, HTN.  Plan: 1. HgbA1c, fasting lipid panel 2. MRI, MRA  of the brain without contrast 3. Echocardiogram 4. Carotid dopplers 5. Prophylactic therapy-Antiplatelet med: Aspirin - dose 81 mg daily 6. Risk factor modification 7. Telemetry monitoring 8. Frequent neuro checks 9. PT/OT SLP   Wyatt Portela, MD [facility] [phone]  02/13/2013, 6:34 PM

## 2013-02-13 NOTE — ED Notes (Signed)
Has been generally not feeling well recently.  Today around 1300 had sudden numbness in L side of face and L arm.  Was unable to lift it at that time, speech was "not plain" according to wife.  No noticeable facial droop or speech slurring.  Bilaterally equal hand grips and leg strength.  No arm drift.  Oriented x 3, unable to state month and year, but family states this is not unusual.  Patient tearful at times.

## 2013-02-13 NOTE — ED Notes (Signed)
Family at bedside. 

## 2013-02-13 NOTE — ED Notes (Signed)
Called to MRI due to patient unable to do MRI due to claustrophobia. Neurology called and received orders to administer ativan.

## 2013-02-13 NOTE — ED Notes (Signed)
Called report to [place], RN unit 4N.

## 2013-02-13 NOTE — H&P (Signed)
[facility] History and Physical  Donald Mcconnell ZOX:[id number] DOB: 16-Mar-1929 DOA: 02/13/2013  Referring physician: ER physician. PCP: Donald Peck, MD   Chief Complaint: Left upper extremity weakness.  HPI: Donald Mcconnell is a 79 y.o. male who was in the car with his wife and son when suddenly he started developing left facial numbness and left upper extremity weakness. Patient was taken to [facility] where he had CT head which did not show anything acute. Patient was transferred to [facility] for further management. On-call neurologist Dr. Cyril Mcconnell has already evaluated the patient and at this time as requested further stroke workup and found patient was outside TPA window period. Patient has memory issues as per the family. Patient did not have any chest pain shortness of breath nausea vomiting abdominal pain. Has had diarrhea 3 days ago which was self-limited. Patient had gone for MRI brain but was unable to be done because of claustrophobia despite being given IV Ativan.  Review of Systems: As presented in the history of presenting illness, rest negative.  Past Medical History  Diagnosis Date  . Hypertension   . Thyroid disease     hypothyroidism  . Gout   . Hypothyroidism    Past Surgical History  Procedure Laterality Date  . Colon surgery    . Shoulder surgery     Social History:  reports that he has never smoked. He does not have any smokeless tobacco history on file. He reports that he does not drink alcohol or use illicit drugs. Home. where does patient live-- Not sure. Can patient participate in ADLs?  No Known Allergies  Family History  Problem Relation Age of Onset  . Prostate cancer Brother       Prior to Admission medications   Medication Sig Start Date End Date Taking? Authorizing Provider  allopurinol (ZYLOPRIM) 300 MG tablet Take 300 mg by mouth daily.   Yes Historical Provider, MD  atenolol (TENORMIN) 50 MG tablet Take 50 mg by mouth  daily.   Yes Historical Provider, MD  levothyroxine (SYNTHROID, LEVOTHROID) 100 MCG tablet Take 100 mcg by mouth daily before breakfast.   Yes Historical Provider, MD   Physical Exam: Filed Vitals:   02/13/13 1900 02/13/13 1905 02/13/13 1915 02/13/13 2024  BP: 183/75 185/76 181/75   Pulse: 55 53 55   Temp:    98.2 F (36.8 C)  TempSrc:      Resp: 16 22 35   Height:      Weight:      SpO2: 100% 99% 98%      General:  Well-developed well-nourished.  Eyes: Anicteric no pallor.  ENT: No discharge from ears eyes nose mouth.  Neck: No mass felt.  Cardiovascular: S1-S2 heard.  Respiratory: No rhonchi or crepitations.  Abdomen: Soft nontender bowel sounds present.  Skin: No rash.  Musculoskeletal: No edema.  Psychiatric: Patient is oriented to his name.  Neurologic: Alert awake oriented to his name. Right upper extremity and lower extremity and left lower extremity are 5 x 5. Left upper extremity is 3 x 5. No facial asymmetry and tongue is midline.  Labs on Admission:  Basic Metabolic Panel:  Recent Labs Lab 02/13/13 1657 02/13/13 1701  NA 141 144  K 3.9 3.9  CL 106 108  CO2 26  --   GLUCOSE 101* 96  BUN 16 16  CREATININE 1.71* 1.70*  CALCIUM 9.5  --    Liver Function Tests:  Recent Labs Lab 02/13/13 1657  AST 19  ALT 13  ALKPHOS 84  BILITOT 0.5  PROT 7.0  ALBUMIN 3.8   No results found for this basename: LIPASE, AMYLASE,  in the last 168 hours No results found for this basename: AMMONIA,  in the last 168 hours CBC:  Recent Labs Lab 02/13/13 1657 02/13/13 1701  WBC 8.6  --   NEUTROABS 4.4  --   HGB 14.0 15.0  HCT 40.4 44.0  MCV 93.5  --   PLT 197  --    Cardiac Enzymes:  Recent Labs Lab 02/13/13 1657  TROPONINI <0.30    BNP (last 3 results) No results found for this basename: PROBNP,  in the last 8760 hours CBG:  Recent Labs Lab 02/13/13 1743  GLUCAP 95    Radiological Exams on Admission: Ct Head Wo Contrast  02/13/2013    CLINICAL DATA:  79 year old male code stroke with acute left side weakness.  EXAM: CT HEAD WITHOUT CONTRAST  TECHNIQUE: Contiguous axial images were obtained from the base of the skull through the vertex without intravenous contrast.  COMPARISON:  05/18/2008.  FINDINGS: Visualized paranasal sinuses and mastoids are clear. Postoperative changes to the orbits. Negative scalp soft tissues. No acute osseous abnormality identified.  Dominant distal left vertebral artery re- identified. Calcified atherosclerosis at the skull base. Mildly decreased cerebral volume. No ventriculomegaly. No midline shift, mass effect, or evidence of intracranial mass lesion. No acute intracranial hemorrhage. Questionable chronic and unchanged small left anterior frontal extra-axial collection. No suspicious intracranial vascular hyperdensity. No evidence of cortically based acute infarction identified.  IMPRESSION: No acute intracranial abnormality with Stable non contrast CT appearance of the brain except for mild generalized volume loss since 2009.  Study discussed by telephone with Dr. Pricilla Mcconnell on 02/13/2013 at 16:51 .   Electronically Signed   By: Donald Mcconnell M.D.   On: 02/13/2013 16:52     Assessment/Plan Principal Problem:   CVA (cerebral vascular accident) Active Problems:   HTN (hypertension)   Renal failure   Hypothyroidism   History of gout   1. CVA - patient has been placed on neurochecks and swallow evaluation. Unable to do MRI brain as patient's claustrophobic. I have conveyed this to on-call neurologist Dr. Thad Mcconnell. Carotid Dopplers and 2-D echo has been ordered. Monitor shows sinus rhythm. Further recommendations per neurologist. 2. History of hypertension - continue home medications with permissive hypertension due to acute stroke. 3. Hypothyroidism - continue Synthroid. 4. Renal failure probably chronic - closely follow intake output and metabolic panel. 5. History of gout - continue present  medications. 6. Memory issues - further recommendations per neurologist.    Code Status: Full code.  Family Communication: Family at the bedside.  Disposition Plan: Admit to inpatient.    Donald Mcconnell N. [facility] Pager [phone].  If 7PM-7AM, please contact night-coverage [email or web address] Password [facility] 02/13/2013, 10:02 PM

## 2013-02-14 DIAGNOSIS — Z862 Personal history of diseases of the blood and blood-forming organs and certain disorders involving the immune mechanism: Secondary | ICD-10-CM

## 2013-02-14 DIAGNOSIS — I359 Nonrheumatic aortic valve disorder, unspecified: Secondary | ICD-10-CM

## 2013-02-14 DIAGNOSIS — I6350 Cerebral infarction due to unspecified occlusion or stenosis of unspecified cerebral artery: Secondary | ICD-10-CM

## 2013-02-14 DIAGNOSIS — Z7982 Long term (current) use of aspirin: Secondary | ICD-10-CM

## 2013-02-14 LAB — COMPREHENSIVE METABOLIC PANEL
ALT: 12 U/L (ref 0–53)
AST: 17 U/L (ref 0–37)
Alkaline Phosphatase: 68 U/L (ref 39–117)
CO2: 26 mEq/L (ref 19–32)
Calcium: 8.8 mg/dL (ref 8.4–10.5)
GFR calc Af Amer: 48 mL/min — ABNORMAL LOW (ref >90)
Glucose, Bld: 103 mg/dL — ABNORMAL HIGH (ref 70–99)
Potassium: 3.7 mEq/L (ref 3.5–5.1)
Sodium: 141 mEq/L (ref 135–145)
Total Protein: 6.1 g/dL (ref 6.0–8.3)

## 2013-02-14 LAB — CBC WITH DIFFERENTIAL/PLATELET
Basophils Absolute: 0 10*3/uL (ref 0.0–0.1)
Basophils Relative: 0 % (ref 0–1)
Eosinophils Absolute: 0.2 10*3/uL (ref 0.0–0.7)
Eosinophils Relative: 2 % (ref 0–5)
HCT: 38.5 % — ABNORMAL LOW (ref 39.0–52.0)
Hemoglobin: 13.2 g/dL (ref 13.0–17.0)
MCH: 31.9 pg (ref 26.0–34.0)
MCHC: 34.3 g/dL (ref 30.0–36.0)
Monocytes Absolute: 1 10*3/uL (ref 0.1–1.0)
Monocytes Relative: 11 % (ref 3–12)
Neutro Abs: 5 10*3/uL (ref 1.7–7.7)
RDW: 14.6 % (ref 11.5–15.5)

## 2013-02-14 LAB — LIPID PANEL
Cholesterol: 152 mg/dL (ref 0–200)
LDL Cholesterol: 96 mg/dL (ref 0–99)
Total CHOL/HDL Ratio: 4.2 RATIO
VLDL: 20 mg/dL (ref 0–40)

## 2013-02-14 LAB — TSH: TSH: 1.122 u[IU]/mL (ref 0.350–4.500)

## 2013-02-14 MED ORDER — ALLOPURINOL 100 MG PO TABS
100.0000 mg | ORAL_TABLET | Freq: Every day | ORAL | Status: DC
  Administered 2013-02-14 – 2013-02-16 (×3): 100 mg via ORAL
  Filled 2013-02-14 (×3): qty 1

## 2013-02-14 NOTE — Progress Notes (Signed)
VASCULAR LAB PRELIMINARY  PRELIMINARY  PRELIMINARY  PRELIMINARY  Carotid duplex  completed.    Preliminary report:  Bilateral:  1-39% ICA stenosis.  Vertebral artery flow is antegrade.      Redford Behrle, RVT 02/14/2013, 10:25 AM

## 2013-02-14 NOTE — Evaluation (Signed)
Physical Therapy Evaluation Patient Details Name: Donald Mcconnell MRN: [id number] DOB: Mar 02, 1929 Today's Date: 02/14/2013 Time: 4098-1191 PT Time Calculation (min): 31 min  PT Assessment / Plan / Recommendation History of Present Illness  admitted with LUE weakness; undergoing stroke workup  Clinical Impression  Pt admitted with above. Pt currently with functional limitations due to the deficits listed below (see PT Problem List).  Pt will benefit from skilled PT to increase their independence and safety with mobility to allow discharge to the venue listed below.   Pt will need a prescription for Outpatient PT for Gait and Balance Dysfunction      PT Assessment  Patient needs continued PT services    Follow Up Recommendations  Outpatient PT;Supervision/Assistance - 24 hour    Does the patient have the potential to tolerate intense rehabilitation      Barriers to Discharge        Equipment Recommendations  Rolling walker with 5" wheels    Recommendations for Other Services OT consult;Speech consult   Frequency Min 4X/week    Precautions / Restrictions Precautions Precautions: Fall   Pertinent Vitals/Pain no apparent distress       Mobility  Bed Mobility Bed Mobility: Supine to Sit;Sitting - Scoot to Edge of Bed Supine to Sit: 4: Min guard Sitting - Scoot to [place] of Bed: 4: Min guard Details for Bed Mobility Assistance: slow-moving, but not needing much assist Transfers Transfers: Sit to Stand;Stand to Sit Sit to Stand: 4: Min guard;From bed;With upper extremity assist (from low hallway bench) Stand to Sit: 4: Min guard;With upper extremity assist;To chair/3-in-1 (and to low hallway bench) Details for Transfer Assistance: Cues for safety, control and hand placement Ambulation/Gait Ambulation/Gait Assistance: 4: Min guard;3: Mod assist Ambulation Distance (Feet): 220 Feet Assistive device: Rolling walker;Straight cane;None Ambulation/Gait Assistance Details: Noted  a few losses of balance requiring light mod assist to regain balance when walking with cane or no assistive device; when walking without assistive device, noted pt tends to reach out for UE support; much better with bil UE support on RW Gait Pattern: Step-through pattern Stairs: Yes Stairs Assistance: 4: Min guard Stair Management Technique: One rail Right;With cane Number of Stairs: 3 Modified Rankin (Stroke Patients Only) Pre-Morbid Rankin Score: No significant disability Modified Rankin: Moderately severe disability    Exercises     PT Diagnosis: Difficulty walking;Other (comment) (gait and balance dysfunction)  PT Problem List: Decreased activity tolerance;Decreased balance;Decreased mobility;Decreased coordination;Decreased cognition;Decreased knowledge of use of DME;Decreased knowledge of precautions PT Treatment Interventions: DME instruction;Gait training;Stair training;Functional mobility training;Therapeutic activities;Therapeutic exercise;Balance training;Neuromuscular re-education;Patient/family education     PT Goals(Current goals can be found in the care plan section) Acute Rehab PT Goals Patient Stated Goal: go home PT Goal Formulation: With patient Time For Goal Achievement: 02/21/13 Potential to Achieve Goals: Good  Visit Information  Last PT Received On: 02/14/13 Assistance Needed: +1 History of Present Illness: admitted with LUE weakness; undergoing stroke workup       Prior Functioning  Home Living Family/patient expects to be discharged to:: Private residence Living Arrangements: Spouse/significant other;Other relatives Available Help at Discharge: Family;Friend(s);Available 24 hours/day Type of Home: House Home Access: Stairs to enter [employer] of Steps: 8 (to back porch) Entrance Stairs-Rails: Right;Left;Can reach both Home Layout: One level Home Equipment: Toilet riser Prior Function Level of Independence: Independent Comments: Family  reports, "He likes his recliner"; Pt states he walk dog up the street twice a week Communication Communication: Expressive difficulties (slow to answer questions,  difficult to understand at time) Dominant Hand: Right    Cognition  Cognition Arousal/Alertness: Awake/alert Behavior During Therapy: WFL for tasks assessed/performed Overall Cognitive Status: Within Functional Limits for tasks assessed (for simple mobility acts) Memory: Decreased short-term memory (Pt states, "can't remember much")    Extremity/Trunk Assessment Upper Extremity Assessment Upper Extremity Assessment: Defer to OT evaluation Lower Extremity Assessment Lower Extremity Assessment: Overall WFL for tasks assessed   Balance    End of Session PT - End of Session Activity Tolerance: Patient tolerated treatment well Patient left: in chair;with call bell/phone within reach;with family/visitor present Nurse Communication: Mobility status  GP     Van Clines [facility] [place], [place] [phone]  02/14/2013, 2:57 PM

## 2013-02-14 NOTE — Progress Notes (Addendum)
NEURO HOSPITALIST PROGRESS NOTE   SUBJECTIVE:                                                                                                                        Doing much better today. He is able to move well his left arm and leg and is able to walk without difficulty. Couldn't tolerate MRI due to claustrophobia. On aspirin 81 mg daily. Carotid ultrasound showed no hemodynamically significant carotid disease. TTE pending. HDL 36, LDL 96, Cholesterol 152, Triglycerides 100  OBJECTIVE:                                                                                                                           Vital signs in last 24 hours: Temp:  [97.2 F (36.2 C)-98.4 F (36.9 C)] 97.6 F (36.4 C) (09/14 1400) Pulse Rate:  [53-73] 65 (09/14 1400) Resp:  [16-35] 20 (09/14 1400) BP: (152-214)/(66-135) 152/71 mmHg (09/14 1400) SpO2:  [98 %-100 %] 100 % (09/14 1400) Weight:  [82.283 kg (181 lb 6.4 oz)] 82.283 kg (181 lb 6.4 oz) (09/13 2207)  Intake/Output from previous day: 09/13 0701 - 09/14 0700 In: -  Out: 100 [Urine:100] Intake/Output this shift: Total I/O In: -  Out: 50 [Urine:50] Nutritional status: Cardiac  Past Medical History  Diagnosis Date  . Hypertension   . Thyroid disease     hypothyroidism  . Gout   . Hypothyroidism     Neurologic Exam:  Mental Status:  Alert, awake, disoriented to year-month, thought content appropriate. Speech fluent without evidence of aphasia. Able to follow 3 step commands without difficulty.  Cranial Nerves:  II: Discs flat bilaterally; Visual fields grossly normal, pupils equal, round, reactive to light and accommodation  III,IV, VI: ptosis not present, extra-ocular motions intact bilaterally  V: facial light touch sensation normal bilaterally  VII: mild left lower face weakness.  VIII: hearing normal bilaterally  IX,X: gag reflex present  XI: bilateral shoulder shrug  XII: midline tongue  extension  Motor:  Significant for subtle left arm weakness.  Tone and bulk:normal tone throughout; no atrophy noted  Sensory: Pinprick and light touch intact throughout, bilaterally  Deep Tendon Reflexes:  2+ all over  Plantars:  Right: downgoing Left: downgoing  Cerebellar:  normal finger-to-nose and normal heel-to-shin test Gait:  No ataxia.  CV: pulses palpable throughout    Lab Results: Lab Results  Component Value Date/Time   CHOL 152 02/14/2013  5:50 AM   Lipid Panel  Recent Labs  02/14/13 0550  CHOL 152  TRIG 100  HDL 36*  CHOLHDL 4.2  VLDL 20  LDLCALC 96    Studies/Results: Dg Chest 2 View  02/13/2013   CLINICAL DATA:  Stroke.  EXAM: CHEST  2 VIEW  COMPARISON:  01/02/2011.  FINDINGS: Borderline cardiomegaly. Both lungs are clear. No effusion or pneumothorax. The visualized skeletal structures are unremarkable.  IMPRESSION: No active cardiopulmonary disease.   Electronically Signed   By: Tiburcio Pea   On: 02/13/2013 23:47   Ct Head Wo Contrast  02/13/2013   CLINICAL DATA:  79 year old male code stroke with acute left side weakness.  EXAM: CT HEAD WITHOUT CONTRAST  TECHNIQUE: Contiguous axial images were obtained from the base of the skull through the vertex without intravenous contrast.  COMPARISON:  05/18/2008.  FINDINGS: Visualized paranasal sinuses and mastoids are clear. Postoperative changes to the orbits. Negative scalp soft tissues. No acute osseous abnormality identified.  Dominant distal left vertebral artery re- identified. Calcified atherosclerosis at the skull base. Mildly decreased cerebral volume. No ventriculomegaly. No midline shift, mass effect, or evidence of intracranial mass lesion. No acute intracranial hemorrhage. Questionable chronic and unchanged small left anterior frontal extra-axial collection. No suspicious intracranial vascular hyperdensity. No evidence of cortically based acute infarction identified.  IMPRESSION: No acute intracranial  abnormality with Stable non contrast CT appearance of the brain except for mild generalized volume loss since 2009.  Study discussed by telephone with Dr. Pricilla Loveless on 02/13/2013 at 16:51 .   Electronically Signed   By: Augusto Gamble M.D.   On: 02/13/2013 16:52    MEDICATIONS                                                                                                                       I have reviewed the patient's current medications.  ASSESSMENT/PLAN:                                                                                                            Probable small right cortical infarct, doing much better at this moment. Continue aspirin. MRI to be attempted again with sedation tomorrow.  Wyatt Portela, MD [facility] [phone]  02/14/2013, 4:26 PM

## 2013-02-14 NOTE — Progress Notes (Signed)
[facility] PROGRESS NOTE  ARNE SCHLENDER ZOX:[id number] DOB: 1929/03/27 DOA: 02/13/2013 PCP: Lorenda Peck, MD  Assessment/Plan:  79 y/o male with PMH of HTN, Hypothyroidism, gout, alzheimer's dementia presented to [facility] with left facial numbness and left upper extremity weakness; was transferred to [facility] for further management. On-call neurologist Dr. Cyril Mourning has already evaluated the patient and at this time as requested further stroke workup and found patient was outside TPA window period.   1. Acute CVA; left arm monoparesis, left face droop, numbness left face-arm, and dysarthria; CT head unremarkable;  -pend MRI/MRA could not do due to claustrophobia; need sedation likely monday  -pend echo, doppler, PT eval; pend HA1C -cont asa, statin; BP monitor   2. Dementia with confusion at baseline; cont monitoring   3. HTN permissive HTN, cont atenolol, prn hydralazine; monitor   4. Hypothyroidism, cont synthroid   5. CKD III; cont monitoring; d/c IVF  -decrease allopurinol dose       Code Status: full  Family Communication: son, wife at the bedside  (indicate person spoken with, relationship, and if by phone, the number) Disposition Plan: pend PT eval    Consultants:  neurology   Procedures:  CT   Antibiotics:  No  (indicate start date, and stop date if known)  HPI/Subjective: Sleeping, follow commands when awake   Objective: Filed Vitals:   02/14/13 0806  BP: 170/90  Pulse: 64  Temp: 97.8 F (36.6 C)  Resp: 22    Intake/Output Summary (Last 24 hours) at 02/14/13 0857 Last data filed at 02/13/13 2300  Gross per 24 hour  Intake      0 ml  Output    100 ml  Net   -100 ml   Filed Weights   02/13/13 1616 02/13/13 2207  Weight: 80.74 kg (178 lb) 82.283 kg (181 lb 6.4 oz)    Exam:   General:  No acute distress   Cardiovascular: s1, s2 rrr  Respiratory: cta bl   Abdomen: soft, nt, nd   Musculoskeletal: no le edema    Data  Reviewed: Basic Metabolic Panel:  Recent Labs Lab 02/13/13 1657 02/13/13 1701 02/14/13 0550  NA 141 144 141  K 3.9 3.9 3.7  CL 106 108 107  CO2 26  --  26  GLUCOSE 101* 96 103*  BUN 16 16 14   CREATININE 1.71* 1.70* 1.50*  CALCIUM 9.5  --  8.8   Liver Function Tests:  Recent Labs Lab 02/13/13 1657 02/14/13 0550  AST 19 17  ALT 13 12  ALKPHOS 84 68  BILITOT 0.5 0.6  PROT 7.0 6.1  ALBUMIN 3.8 3.2*   No results found for this basename: LIPASE, AMYLASE,  in the last 168 hours No results found for this basename: AMMONIA,  in the last 168 hours CBC:  Recent Labs Lab 02/13/13 1657 02/13/13 1701 02/14/13 0550  WBC 8.6  --  8.7  NEUTROABS 4.4  --  5.0  HGB 14.0 15.0 13.2  HCT 40.4 44.0 38.5*  MCV 93.5  --  93.0  PLT 197  --  167   Cardiac Enzymes:  Recent Labs Lab 02/13/13 1657  TROPONINI <0.30   BNP (last 3 results) No results found for this basename: PROBNP,  in the last 8760 hours CBG:  Recent Labs Lab 02/13/13 1743  GLUCAP 95    No results found for this or any previous visit (from the past 240 hour(s)).   Studies: Dg Chest 2 View  02/13/2013  CLINICAL DATA:  Stroke.  EXAM: CHEST  2 VIEW  COMPARISON:  01/02/2011.  FINDINGS: Borderline cardiomegaly. Both lungs are clear. No effusion or pneumothorax. The visualized skeletal structures are unremarkable.  IMPRESSION: No active cardiopulmonary disease.   Electronically Signed   By: Tiburcio Pea   On: 02/13/2013 23:47   Ct Head Wo Contrast  02/13/2013   CLINICAL DATA:  79 year old male code stroke with acute left side weakness.  EXAM: CT HEAD WITHOUT CONTRAST  TECHNIQUE: Contiguous axial images were obtained from the base of the skull through the vertex without intravenous contrast.  COMPARISON:  05/18/2008.  FINDINGS: Visualized paranasal sinuses and mastoids are clear. Postoperative changes to the orbits. Negative scalp soft tissues. No acute osseous abnormality identified.  Dominant distal left  vertebral artery re- identified. Calcified atherosclerosis at the skull base. Mildly decreased cerebral volume. No ventriculomegaly. No midline shift, mass effect, or evidence of intracranial mass lesion. No acute intracranial hemorrhage. Questionable chronic and unchanged small left anterior frontal extra-axial collection. No suspicious intracranial vascular hyperdensity. No evidence of cortically based acute infarction identified.  IMPRESSION: No acute intracranial abnormality with Stable non contrast CT appearance of the brain except for mild generalized volume loss since 2009.  Study discussed by telephone with Dr. Pricilla Loveless on 02/13/2013 at 16:51 .   Electronically Signed   By: Augusto Gamble M.D.   On: 02/13/2013 16:52    Scheduled Meds: . sodium chloride   Intravenous STAT  . allopurinol  300 mg Oral Daily  . aspirin EC  81 mg Oral Daily  . atenolol  50 mg Oral Daily  . enoxaparin (LOVENOX) injection  40 mg Subcutaneous Q24H  . influenza vac split quadrivalent PF  0.5 mL Intramuscular Tomorrow-1000  . levothyroxine  100 mcg Oral QAC breakfast   Continuous Infusions: . sodium chloride 75 mL/hr at 02/13/13 2251    Principal Problem:   CVA (cerebral vascular accident) Active Problems:   HTN (hypertension)   Renal failure   Hypothyroidism   History of gout    Time spent: > 35 minutes     Esperanza Sheets  [facility] Pager [phone]. If 7PM-7AM, please contact night-coverage at [email or web address], password [facility] 02/14/2013, 8:57 AM  LOS: 1 day

## 2013-02-14 NOTE — Progress Notes (Signed)
  Echocardiogram 2D Echocardiogram has been performed.  Jorje Guild 02/14/2013, 12:34 PM

## 2013-02-15 ENCOUNTER — Inpatient Hospital Stay (HOSPITAL_COMMUNITY): Payer: Medicare Other

## 2013-02-15 ENCOUNTER — Encounter (HOSPITAL_COMMUNITY): Payer: Self-pay | Admitting: Radiology

## 2013-02-15 MED ORDER — FENTANYL CITRATE 0.05 MG/ML IJ SOLN
INTRAMUSCULAR | Status: AC
  Filled 2013-02-15: qty 4

## 2013-02-15 MED ORDER — ENSURE COMPLETE PO LIQD
237.0000 mL | Freq: Two times a day (BID) | ORAL | Status: DC
  Administered 2013-02-15 – 2013-02-16 (×2): 237 mL via ORAL

## 2013-02-15 MED ORDER — MIDAZOLAM HCL 2 MG/2ML IJ SOLN
1.0000 mg | INTRAMUSCULAR | Status: DC | PRN
  Administered 2013-02-15: 2 mg via INTRAVENOUS
  Administered 2013-02-15: 1 mg via INTRAVENOUS
  Administered 2013-02-15: 0.5 mg via INTRAVENOUS
  Filled 2013-02-15: qty 10

## 2013-02-15 MED ORDER — MIDAZOLAM HCL 2 MG/2ML IJ SOLN
1.0000 mg | INTRAMUSCULAR | Status: DC | PRN
  Filled 2013-02-15: qty 10

## 2013-02-15 MED ORDER — MIDAZOLAM HCL 2 MG/2ML IJ SOLN
INTRAMUSCULAR | Status: AC
  Filled 2013-02-15: qty 10

## 2013-02-15 MED ORDER — FENTANYL CITRATE 0.05 MG/ML IJ SOLN: 25.0000 ug | INTRAMUSCULAR | Status: DC | PRN

## 2013-02-15 MED ORDER — ZOLPIDEM TARTRATE 5 MG PO TABS
5.0000 mg | ORAL_TABLET | Freq: Every evening | ORAL | Status: DC | PRN
  Administered 2013-02-15: 5 mg via ORAL
  Filled 2013-02-15: qty 1

## 2013-02-15 MED ORDER — SALINE SPRAY 0.65 % NA SOLN
1.0000 | NASAL | Status: DC | PRN
  Administered 2013-02-15: 1 via NASAL
  Filled 2013-02-15: qty 44

## 2013-02-15 MED ORDER — FENTANYL CITRATE 0.05 MG/ML IJ SOLN
25.0000 ug | INTRAMUSCULAR | Status: DC | PRN
  Administered 2013-02-15 (×3): 25 ug via INTRAVENOUS

## 2013-02-15 MED ORDER — DIPHENHYDRAMINE HCL 25 MG PO CAPS: 50.0000 mg | ORAL_CAPSULE | Freq: Every evening | ORAL | Status: DC | PRN

## 2013-02-15 MED ORDER — AMLODIPINE BESYLATE 2.5 MG PO TABS
2.5000 mg | ORAL_TABLET | Freq: Every day | ORAL | Status: DC
  Administered 2013-02-15 – 2013-02-16 (×2): 2.5 mg via ORAL
  Filled 2013-02-15 (×2): qty 1

## 2013-02-15 NOTE — Progress Notes (Signed)
I agree with above plan Delia Heady, MD

## 2013-02-15 NOTE — Progress Notes (Signed)
[facility] PROGRESS NOTE  Donald Mcconnell XBJ:[id number] DOB: Nov 20, 1928 DOA: 02/13/2013 PCP: Lorenda Peck, MD  Assessment/Plan:  79 y/o male with PMH of HTN, Hypothyroidism, gout, alzheimer's dementia presented to [facility] with left facial numbness and left upper extremity weakness; was transferred to [facility] for further management. On-call neurologist Dr. Cyril Mourning has already evaluated the patient and at this time as requested further stroke workup and found patient was outside TPA window period.   1. Acute CVA; left arm monoparesis, left face droop, numbness left face-arm, and dysarthria; CT head unremarkable;  -echo-LVEF 55%, doppler< 39%, PT eval: outpatient PT; HA1C-5.6 -pend MRI/MRA could not do due to claustrophobia; need sedation likely monday  -cont asa, statin; BP monitor   2. Dementia with confusion at baseline; cont monitoring   3. HTN permissive HTN, cont atenolol, added low dose amlodipine; prn hydralazine; monitor   4. Hypothyroidism, cont synthroid   5. CKD III; cont monitoring; d/c IVF  -decrease allopurinol dose       Code Status: full  Family Communication: son, wife at the bedside  (indicate person spoken with, relationship, and if by phone, the number) Disposition Plan: pend PT eval    Consultants:  neurology   Procedures:  CT   Antibiotics:  No  (indicate start date, and stop date if known)  HPI/Subjective: Sleeping, follow commands when awake   Objective: Filed Vitals:   02/15/13 0700  BP: 171/65  Pulse: 68  Temp: 98.1 F (36.7 C)  Resp: 20   No intake or output data in the 24 hours ending 02/15/13 0831 Filed Weights   02/13/13 1616 02/13/13 2207  Weight: 80.74 kg (178 lb) 82.283 kg (181 lb 6.4 oz)    Exam:   General:  No acute distress   Cardiovascular: s1, s2 rrr  Respiratory: cta bl   Abdomen: soft, nt, nd   Musculoskeletal: no le edema    Data Reviewed: Basic Metabolic Panel:  Recent Labs Lab  02/13/13 1657 02/13/13 1701 02/14/13 0550  NA 141 144 141  K 3.9 3.9 3.7  CL 106 108 107  CO2 26  --  26  GLUCOSE 101* 96 103*  BUN 16 16 14   CREATININE 1.71* 1.70* 1.50*  CALCIUM 9.5  --  8.8   Liver Function Tests:  Recent Labs Lab 02/13/13 1657 02/14/13 0550  AST 19 17  ALT 13 12  ALKPHOS 84 68  BILITOT 0.5 0.6  PROT 7.0 6.1  ALBUMIN 3.8 3.2*   No results found for this basename: LIPASE, AMYLASE,  in the last 168 hours No results found for this basename: AMMONIA,  in the last 168 hours CBC:  Recent Labs Lab 02/13/13 1657 02/13/13 1701 02/14/13 0550  WBC 8.6  --  8.7  NEUTROABS 4.4  --  5.0  HGB 14.0 15.0 13.2  HCT 40.4 44.0 38.5*  MCV 93.5  --  93.0  PLT 197  --  167   Cardiac Enzymes:  Recent Labs Lab 02/13/13 1657  TROPONINI <0.30   BNP (last 3 results) No results found for this basename: PROBNP,  in the last 8760 hours CBG:  Recent Labs Lab 02/13/13 1743  GLUCAP 95    No results found for this or any previous visit (from the past 240 hour(s)).   Studies: Dg Chest 2 View  02/13/2013   CLINICAL DATA:  Stroke.  EXAM: CHEST  2 VIEW  COMPARISON:  01/02/2011.  FINDINGS: Borderline cardiomegaly. Both lungs are clear. No effusion or pneumothorax.  The visualized skeletal structures are unremarkable.  IMPRESSION: No active cardiopulmonary disease.   Electronically Signed   By: Tiburcio Pea   On: 02/13/2013 23:47   Ct Head Wo Contrast  02/13/2013   CLINICAL DATA:  79 year old male code stroke with acute left side weakness.  EXAM: CT HEAD WITHOUT CONTRAST  TECHNIQUE: Contiguous axial images were obtained from the base of the skull through the vertex without intravenous contrast.  COMPARISON:  05/18/2008.  FINDINGS: Visualized paranasal sinuses and mastoids are clear. Postoperative changes to the orbits. Negative scalp soft tissues. No acute osseous abnormality identified.  Dominant distal left vertebral artery re- identified. Calcified atherosclerosis at  the skull base. Mildly decreased cerebral volume. No ventriculomegaly. No midline shift, mass effect, or evidence of intracranial mass lesion. No acute intracranial hemorrhage. Questionable chronic and unchanged small left anterior frontal extra-axial collection. No suspicious intracranial vascular hyperdensity. No evidence of cortically based acute infarction identified.  IMPRESSION: No acute intracranial abnormality with Stable non contrast CT appearance of the brain except for mild generalized volume loss since 2009.  Study discussed by telephone with Dr. Pricilla Loveless on 02/13/2013 at 16:51 .   Electronically Signed   By: Augusto Gamble M.D.   On: 02/13/2013 16:52    Scheduled Meds: . allopurinol  100 mg Oral Daily  . aspirin EC  81 mg Oral Daily  . atenolol  50 mg Oral Daily  . enoxaparin (LOVENOX) injection  40 mg Subcutaneous Q24H  . levothyroxine  100 mcg Oral QAC breakfast   Continuous Infusions:    Principal Problem:   CVA (cerebral vascular accident) Active Problems:   HTN (hypertension)   Renal failure   Hypothyroidism   History of gout    Time spent: > 35 minutes     Esperanza Sheets  [facility] Pager [phone]. If 7PM-7AM, please contact night-coverage at [email or web address], password [facility] 02/15/2013, 8:31 AM  LOS: 2 days

## 2013-02-15 NOTE — Progress Notes (Signed)
PT Cancellation Note  Patient Details Name: Donald Mcconnell MRN: [id number] DOB: [birth year]   Cancelled Treatment:    Reason Eval/Treat Not Completed: Fatigue/lethargy limiting ability to participate; pt briefly arouses and right back to sleep. Family requesting return after MRI. Will attempt to see after MRI to review safe use of RW.   Nikky Duba 02/15/2013, 9:43 AM Pager [phone]

## 2013-02-15 NOTE — Progress Notes (Signed)
Stroke Team Progress Note  HISTORY Donald Mcconnell is an 79 y.o. male with a past medical history significant for HTN, MCI, hypothyroidism, gout, who was in his usual state of health until 1 pm today 02/13/2013 when he developed acute onset left arm-face numbness and weakness. He was driving with his wife in route to [facility] when he complained to his wife that the left side of his face was numb and he couldn't use the left arm properly. They presented to [facility] ED about 3.5 hours after symptoms onset and the code stroke was activated. Urgent CT brain showed no acute abnormality. NIHSS was not performed at [facility] ED.  In any case, when he arrived to [facility] ED he was out of the window for IV thrombolysis, his symptoms were improving, and his SBP>200.  Denies associated HA, vertigo, double vision, difficulty swallowing, confusion, or visual disturbances.  He speech is dysarthric. He is not on antiplatelet therapy.   Patient was not a TPA candidate secondary to delay in arrival.   SUBJECTIVE He is currently in MRI where the physician saw him. Per nurse, patient is anxious for discharge.  OBJECTIVE Most recent Vital Signs: Filed Vitals:   02/15/13 0700 02/15/13 0948 02/15/13 1147 02/15/13 1230  BP: 171/65 188/84 159/104 153/96  Pulse: 68 57 62 57  Temp: 98.1 F (36.7 C) 97.8 F (36.6 C)    TempSrc: Oral Oral    Resp: 20 20  17   Height:      Weight:      SpO2: 99% 98% 97% 95%   CBG (last 3)   Recent Labs  02/13/13 1743  GLUCAP 95    IV Fluid Intake:     MEDICATIONS  . allopurinol  100 mg Oral Daily  . amLODipine  2.5 mg Oral Daily  . aspirin EC  81 mg Oral Daily  . atenolol  50 mg Oral Daily  . enoxaparin (LOVENOX) injection  40 mg Subcutaneous Q24H  . fentaNYL      . levothyroxine  100 mcg Oral QAC breakfast  . midazolam       PRN:  fentaNYL, fentaNYL, hydrALAZINE, midazolam, midazolam, senna-docusate  Diet:  Cardiac thin liquids Activity:  OOB with assistance DVT  Prophylaxis:  Lovenox 40 mg sq daily   CLINICALLY SIGNIFICANT STUDIES Basic Metabolic Panel:   Recent Labs Lab 02/13/13 1657 02/13/13 1701 02/14/13 0550  NA 141 144 141  K 3.9 3.9 3.7  CL 106 108 107  CO2 26  --  26  GLUCOSE 101* 96 103*  BUN 16 16 14   CREATININE 1.71* 1.70* 1.50*  CALCIUM 9.5  --  8.8   Liver Function Tests:   Recent Labs Lab 02/13/13 1657 02/14/13 0550  AST 19 17  ALT 13 12  ALKPHOS 84 68  BILITOT 0.5 0.6  PROT 7.0 6.1  ALBUMIN 3.8 3.2*   CBC:   Recent Labs Lab 02/13/13 1657 02/13/13 1701 02/14/13 0550  WBC 8.6  --  8.7  NEUTROABS 4.4  --  5.0  HGB 14.0 15.0 13.2  HCT 40.4 44.0 38.5*  MCV 93.5  --  93.0  PLT 197  --  167   Coagulation:   Recent Labs Lab 02/13/13 1657  LABPROT 12.6  INR 0.96   Cardiac Enzymes:   Recent Labs Lab 02/13/13 1657  TROPONINI <0.30   Urinalysis:   Recent Labs Lab 02/13/13 1751  COLORURINE YELLOW  LABSPEC 1.005  PHURINE 5.5  GLUCOSEU NEGATIVE  HGBUR NEGATIVE  BILIRUBINUR NEGATIVE  KETONESUR NEGATIVE  PROTEINUR NEGATIVE  UROBILINOGEN 0.2  NITRITE NEGATIVE  LEUKOCYTESUR NEGATIVE   Lipid Panel    Component Value Date/Time   CHOL 152 02/14/2013 0550   TRIG 100 02/14/2013 0550   HDL 36* 02/14/2013 0550   CHOLHDL 4.2 02/14/2013 0550   VLDL 20 02/14/2013 0550   LDLCALC 96 02/14/2013 0550   HgbA1C  Lab Results  Component Value Date   HGBA1C 5.6 02/14/2013    Urine Drug Screen:     Component Value Date/Time   LABOPIA NONE DETECTED 02/13/2013 1751   COCAINSCRNUR NONE DETECTED 02/13/2013 1751   LABBENZ NONE DETECTED 02/13/2013 1751   AMPHETMU NONE DETECTED 02/13/2013 1751   THCU NONE DETECTED 02/13/2013 1751   LABBARB NONE DETECTED 02/13/2013 1751    Alcohol Level:   Recent Labs Lab 02/13/13 1657  ETH <11    CT of the brain  02/13/2013   No acute intracranial abnormality with Stable non contrast CT appearance of the brain except for mild generalized volume loss since 2009.    MRI of the  brain    MRA of the brain    2D Echocardiogram    Carotid Doppler    CXR  02/13/2013    No active cardiopulmonary disease.    EKG  unchanged from previous tracings, sinus bradycardia.   Therapy Recommendations   Physical Exam   Elderly caucasian male not in distress.Awake alert. Afebrile. Head is nontraumatic. Neck is supple without bruit. Hearing is normal. Cardiac exam no murmur or gallop. Lungs are clear to auscultation. Distal pulses are well felt. Neurological Exam ; Awake alert oriented x 3 normal speech and language. Mild left lower face asymmetry. Tongue midline. No drift. Mild diminished fine finger movements on left. Orbits right over left upper extremity. Mild left grip , hip flexor and ankle dorsiflexor weak.. Normal sensation . Normal coordination. ASSESSMENT Mr. Donald Mcconnell is a 79 y.o. male presenting with left arm-face weakness and numbness. Imaging pending. Suspect right brain stroke. On no antithrombotic prior to admission. Now on aspirin 81 mg orally every day for secondary stroke prevention. Patient with resultant LUE hemiparesis and sensory loss. Work up underway.  Hypertension  Hospital day # 2  TREATMENT/PLAN  Continue aspirin 81 mg orally every day for secondary stroke prevention.  F/u MRI, MRA, 2D, carotid dopplers  Annie Main, MSN, RN, ANVP-BC, ANP-BC, GNP-BC [facility] Pager: [phone] 02/15/2013 12:45 PM  I have personally obtained a history, examined the patient, evaluated imaging results, and formulated the assessment and plan of care. I agree with the above. Delia Heady, MD

## 2013-02-15 NOTE — Progress Notes (Signed)
Physical Therapy Treatment Patient Details Name: Donald Mcconnell MRN: [id number] DOB: 05-Nov-1928 Today's Date: 02/15/2013 Time: 1533-1600 PT Time Calculation (min): 27 min  PT Assessment / Plan / Recommendation  History of Present Illness 79 yo male admitted due to Lt UE weakness. MRI + Rt Frontoparietal infarct   PT Comments   Wife present throughout session. Pt slightly drowsy-feeling s/p sedation for MRI, however both pt and wife feel ready to go home. Wife reports pt will have 24 hr care at home. Both are in agreement that he should use his RW and continue with OPPT. Provided information re: choosing an OPPT to address his balance deficits.    Follow Up Recommendations  Outpatient PT;Supervision/Assistance - 24 hour     Does the patient have the potential to tolerate intense rehabilitation     Barriers to Discharge        Equipment Recommendations  None recommended by PT (wife reports they have a RW)    Recommendations for Other Services    Frequency Min 4X/week   Progress towards PT Goals Progress towards PT goals: Progressing toward goals  Plan Current plan remains appropriate    Precautions / Restrictions Precautions Precautions: Fall Precaution Comments: Pt agrees he is not as steady as before and agrees to use RW   Pertinent Vitals/Pain Denied pain    Mobility  Bed Mobility Bed Mobility: Supine to Sit;Sitting - Scoot to Edge of Bed Supine to Sit: 5: Supervision;HOB flat Sitting - Scoot to Edge of Bed: 5: Supervision Details for Bed Mobility Assistance: decr speed and incr time required Transfers Transfers: Sit to Stand;Stand to Sit Sit to Stand: 4: Min guard;With upper extremity assist Stand to Sit: 4: Min guard;With upper extremity assist;To bed Details for Transfer Assistance: x2; vc for proper sequencing with RW Ambulation/Gait Ambulation/Gait Assistance: 4: Min guard;4: Min assist Ambulation Distance (Feet): 180 Feet Assistive device: Rolling  walker;None Ambulation/Gait Assistance Details: initially used RW with good stability; attempted without RW as pt felt he was doing better than yesterday. Pt with slight stagger step to his Lt x 2 and then agreed he needs to continue use of RW.  Gait Pattern: Step-through pattern;Decreased stride length;Shuffle Stairs: Yes Stairs Assistance: 4: Min assist Stairs Assistance Details (indicate cue type and reason): pt has 2nd rail in reach at home; provided HHA to simulate home environment Stair Management Technique: One rail Right;Alternating pattern;Forwards;Other (comment) (HHA opposite side with rail) Number of Stairs: 2 (wife reports only 2 steps at front) Modified Rankin (Stroke Patients Only) Pre-Morbid Rankin Score: No significant disability Modified Rankin: Moderately severe disability    Exercises     PT Diagnosis:    PT Problem List:   PT Treatment Interventions:     PT Goals (current goals can now be found in the care plan section) Acute Rehab PT Goals Patient Stated Goal: go home  Visit Information  Last PT Received On: 02/15/13 Assistance Needed: +1 History of Present Illness: 79 yo male admitted due to Lt UE weakness. MRI + Rt Frontoparietal infarct    Subjective Data  Patient Stated Goal: go home   Cognition  Cognition Arousal/Alertness: Awake/alert Behavior During Therapy: WFL for tasks assessed/performed Overall Cognitive Status: History of cognitive impairments - at baseline Memory:  (Per wife "he has Alzheimers" )    Balance  High Level Balance High Level Balance Activites: Head turns;Turns High Level Balance Comments: pt currently high fall risk due to decr balance. Question current balance deficits due to medicaiton  End  of Session PT - End of Session Equipment Utilized During Treatment: Gait belt Activity Tolerance: Patient tolerated treatment well Patient left: in chair;with call bell/phone within reach;with family/visitor present Nurse Communication:  Mobility status;Other (comment) (f/u OPPT recommended)   GP     Ellene Bloodsaw 02/15/2013, 4:11 PM Pager [phone]

## 2013-02-15 NOTE — Progress Notes (Signed)
INITIAL NUTRITION ASSESSMENT  DOCUMENTATION CODES Per approved criteria  -Not Applicable   INTERVENTION:  Ensure Complete twice daily (350 kcals, 13 gm protein per 8 fl oz bottle) RD to follow for nutrition care plan  NUTRITION DIAGNOSIS: No nutrition diagnosis at this time    Goal: Pt to meet >/= 90% of their estimated nutrition needs   Monitor:  PO & supplemental intake, weight, labs, I/O's  Reason for Assessment: Malnutrition Screening Tool Report  79 y.o. male  Admitting Dx: CVA (cerebral vascular accident)  ASSESSMENT: Patient with PMH of HTN, hypothyroidism, gout, Alzheimer's dementia presented to [facility] with left facial numbness and left upper extremity weakness; transferred to [facility] for further management.  RD spoke with patient's wife at bedside; reports patient is eating well; no % PO intake available per flowsheet records; denies recent weight loss; wife mentioned the patient drank 3 Ensure supplements yesterday; no orders in place ---> RD to add.  Height: Ht Readings from Last 1 Encounters:  02/13/13 5\' 6"  (1.676 m)    Weight: Wt Readings from Last 1 Encounters:  02/13/13 181 lb 6.4 oz (82.283 kg)    Ideal Body Weight: 142 lb  % Ideal Body Weight: 127%  Wt Readings from Last 10 Encounters:  02/13/13 181 lb 6.4 oz (82.283 kg)    Usual Body Weight: ---  % Usual Body Weight: ---  BMI:  Body mass index is 29.29 kg/(m^2).  Estimated Nutritional Needs: Kcal: 1800-2000 Protein: 90-100 gm Fluid: 1.8-2.0 L  Skin: Intact  Diet Order: Cardiac  EDUCATION NEEDS: -No education needs identified at this time  Labs:   Recent Labs Lab 02/13/13 1657 02/13/13 1701 02/14/13 0550  NA 141 144 141  K 3.9 3.9 3.7  CL 106 108 107  CO2 26  --  26  BUN 16 16 14   CREATININE 1.71* 1.70* 1.50*  CALCIUM 9.5  --  8.8  GLUCOSE 101* 96 103*    CBG (last 3)   Recent Labs  02/13/13 1743  GLUCAP 95    Scheduled Meds: . allopurinol  100 mg Oral Daily  .  amLODipine  2.5 mg Oral Daily  . aspirin EC  81 mg Oral Daily  . atenolol  50 mg Oral Daily  . enoxaparin (LOVENOX) injection  40 mg Subcutaneous Q24H  . fentaNYL      . levothyroxine  100 mcg Oral QAC breakfast  . midazolam        Continuous Infusions:   Past Medical History  Diagnosis Date  . Hypertension   . Thyroid disease     hypothyroidism  . Gout   . Hypothyroidism     Past Surgical History  Procedure Laterality Date  . Colon surgery    . Shoulder surgery    . Eye surgery      cateract surgery    Maureen Chatters, RD, LDN Pager #: [phone] After-Hours Pager #: [phone]

## 2013-02-15 NOTE — Evaluation (Signed)
Occupational Therapy Evaluation Patient Details Name: Donald Mcconnell MRN: [id number] DOB: [birth year] Today's Date: 02/15/2013 Time: 4132-4401 OT Time Calculation (min): 21 min  OT Assessment / Plan / Recommendation History of present illness 79 yo male admitted due to Lt UE weakness. MRI + Rt Frontoparietal infarct   Clinical Impression   Pt currently demonstrates balance deficits. Pt s/p MRI with sedating medications. Question if balance currently affected by medications. PT and wife feel comfortable to d/c home. Recommend outpatient OT follow up. Pt describes Lt thumb sensation changes    OT Assessment  Patient needs continued OT Services    Follow Up Recommendations  Outpatient OT (if transportation is issue please order HHOT)    Barriers to Discharge      Equipment Recommendations  None recommended by OT    Recommendations for Other Services    Frequency  Min 2X/week    Precautions / Restrictions Precautions Precautions: Fall   Pertinent Vitals/Pain No symptoms reported    ADL  Grooming: Wash/dry hands;Minimal assistance Where Assessed - Grooming: Unsupported standing Toilet Transfer: Minimal assistance Toilet Transfer Method: Sit to stand Toilet Transfer Equipment: Regular height toilet (standing with RW) Toileting - Clothing Manipulation and Hygiene: Min guard Where Assessed - Toileting Clothing Manipulation and Hygiene: Sit to stand from 3-in-1 or toilet Equipment Used: Gait belt;Rolling walker Transfers/Ambulation Related to ADLs: pt ambulated from EOB to bathroom. Pt attempting to exit room and enter hall. Pt given v/c again to transfer to the "potty" Pt' s wife states "he calls it the bathroom" Pt given v/c "lets go to the bathroom" Pt again attempting to exit into the hall. Pt needed redirection to task. Pt with LOB with head turns. Pt s/p sedation for MRI. Pt unsteady at this point and time. Family and patient advised to only ambulate with staff due to balance  deficits ADL Comments: pt supine drowzy on arrival. pt aroused to name call. Pt completed supine<>Sit and transfer to bathroom. Pt voiding bladder. Pt needed cues to scan environment for hand hygiene. Pt wife and daughter feel that patient is at an adequate level for d/c home without concerns.    OT Diagnosis: Generalized weakness;Cognitive deficits  OT Problem List: Decreased strength;Decreased activity tolerance;Impaired balance (sitting and/or standing);Decreased safety awareness;Decreased knowledge of use of DME or AE;Decreased knowledge of precautions OT Treatment Interventions: Self-care/ADL training;Therapeutic exercise;Neuromuscular education;DME and/or AE instruction;Therapeutic activities;Cognitive remediation/compensation;Patient/family education;Balance training   OT Goals(Current goals can be found in the care plan section) Acute Rehab OT Goals Patient Stated Goal: go home OT Goal Formulation: With patient/family Time For Goal Achievement: 03/01/13 Potential to Achieve Goals: Good ADL Goals Pt Will Perform Upper Body Bathing: with modified independence;standing Pt Will Perform Lower Body Bathing: with modified independence;sit to/from stand Pt Will Transfer to Toilet: with modified independence;regular height toilet  Visit Information  Last OT Received On: 02/15/13 Assistance Needed: +1 History of Present Illness: 79 yo male admitted due to Lt UE weakness. MRI + Rt Frontoparietal infarct       Prior Functioning     Home Living Family/patient expects to be discharged to:: Private residence Living Arrangements: Spouse/significant other;Other relatives Available Help at Discharge: Family;Friend(s);Available 24 hours/day Type of Home: House Home Access: Stairs to enter [employer] of Steps: 8 Entrance Stairs-Rails: Right;Left;Can reach both Home Layout: One level Home Equipment: Toilet riser Prior Function Level of Independence: Independent Comments: Family  reports, "He likes his recliner"; Pt states he walks the dog up the street twice a week Communication Communication:  Other (comment) (per family speech is baseline ) Dominant Hand: Right         Vision/Perception Vision - History Baseline Vision: Wears glasses all the time Patient Visual Report: No change from baseline   Cognition  Cognition Arousal/Alertness: Awake/alert Behavior During Therapy: WFL for tasks assessed/performed Overall Cognitive Status: History of cognitive impairments - at baseline Memory:  (Per wife "he has Alzheimers" )    Extremity/Trunk Assessment Upper Extremity Assessment Upper Extremity Assessment: Generalized weakness;RUE deficits/detail;LUE deficits/detail RUE Deficits / Details: GROSS AROM wfl during assessment RUE Coordination: decreased gross motor LUE Deficits / Details: pt c/o left thumb numbness. Pt demonstrates decr thumb abduction, adduction and opposition. Per RN Marylene Land pt with previous thumb injury. Wife reports injury occurred helping someone put down carpet. Pt perseverating on thumb numbness LUE Sensation: decreased light touch (reports thumb numb)     Mobility Bed Mobility Supine to Sit: 4: Min guard;With rails;HOB elevated Sitting - Scoot to Edge of Bed: 4: Min guard;With rail Details for Bed Mobility Assistance: decr speed and incr time required Transfers Transfers: Sit to Stand;Stand to Sit Sit to Stand: 4: Min guard;With upper extremity assist;From bed Stand to Sit: 4: Min guard;With upper extremity assist;To bed Details for Transfer Assistance: posterior lob x1 attempt. Pt second attempt achieved upright posture.     Exercise     Balance High Level Balance High Level Balance Activites: Head turns;Turns High Level Balance Comments: pt currently high fall risk due to decr balance. Question current balance deficits due to medicaiton   End of Session OT - End of Session Activity Tolerance: Patient tolerated treatment  well Patient left: in bed;with call bell/phone within reach;with family/visitor present Nurse Communication: Mobility status;Precautions  GO     Harolyn Rutherford 02/15/2013, 3:02 PM Pager: [phone]

## 2013-02-15 NOTE — Evaluation (Signed)
SLP Cancellation Note  Patient Details Name: Donald Mcconnell MRN: [id number] DOB: April 28, 1929   Cancelled treatment:       Reason Eval/Treat Not Completed:  (wife was transferring pt to restroom will attempt another time)    Donavan Burnet, [place] [facility] SLP [phone]

## 2013-02-15 NOTE — ED Notes (Signed)
Pt moved into scanner and ET CO2 not displayed on monitor.  Tubing changed out and placement of nasal canula verified.  Pt O2 sats in the high 90's.  Will continue to monitor pt

## 2013-02-15 NOTE — Progress Notes (Signed)
MRA Head 02/15/2013   No major vessel occlusion or correctable proximal stenosis. Specifically, no focal finding in the right MCA territory. More distal branch vessels do show mild atherosclerotic irregularity diffusely.      MRI Brain 02/15/2013   Acute infarction in the right frontoparietal junction region, possibly affecting the precentral gyrus. No hemorrhage or mass effect/ significant swelling.    Acute right brain infarct felt to be embolic etiology.  Dr. Pearlean Brownie discussed with Dr.Buriev.  TEE to look for embolic source. Arranged with [facility] for tomorrow.  If positive for PFO (patent foramen ovale), check bilateral lower extremity venous dopplers to rule out DVT as possible source of stroke.  Annie Main, MSN, RN, ANVP-BC, ANP-BC, [facility] Pager: [phone] 02/15/2013 6:06 PM  I have personally obtained a history, examined the patient, evaluated imaging results, and formulated the assessment and plan of care. I agree with the above.

## 2013-02-15 NOTE — H&P (Signed)
Referring Physician: Dr. York Spaniel HPI: Donald Mcconnell is an 79 y.o. male with PMHx of dementia, presented to [facility] from [facility] for left facial numbness and LUE weakness. Ct head performed 02/13/13 no acute abnormality seen. MR was attempted however was unable to be done secondary to claustrophobia. Request for conscious sedation with MRI/MRA today.   Past Medical History:  Past Medical History  Diagnosis Date  . Hypertension   . Thyroid disease     hypothyroidism  . Gout   . Hypothyroidism     Past Surgical History:  Past Surgical History  Procedure Laterality Date  . Colon surgery    . Shoulder surgery    . Eye surgery      cateract surgery    Family History:  Family History  Problem Relation Age of Onset  . Prostate cancer Brother     Social History:  reports that he has never smoked. He does not have any smokeless tobacco history on file. He reports that he does not drink alcohol or use illicit drugs.  Allergies: No Known Allergies    Medication List    ASK your doctor about these medications       allopurinol 300 MG tablet  Commonly known as:  ZYLOPRIM  Take 300 mg by mouth daily.     atenolol 50 MG tablet  Commonly known as:  TENORMIN  Take 50 mg by mouth daily.     levothyroxine 100 MCG tablet  Commonly known as:  SYNTHROID, LEVOTHROID  Take 100 mcg by mouth daily before breakfast.        Please HPI for pertinent positives, otherwise complete 10 system ROS negative.  Physical Exam: BP 159/104  Pulse 62  Temp(Src) 97.8 F (36.6 C) (Oral)  Resp 20  Ht 5\' 6"  (1.676 m)  Wt 181 lb 6.4 oz (82.283 kg)  BMI 29.29 kg/m2  SpO2 97% Body mass index is 29.29 kg/(m^2).   General Appearance:  Alert, cooperative, no distress, disoriented to year and month. No aphasia present.  Head:  Normocephalic, without obvious abnormality, atraumatic  Lungs:   Clear to auscultation bilaterally, no w/r/r, respirations unlabored without use of accessory muscles.  Chest Wall:  No  tenderness or deformity  Heart:  Regular rate and rhythm, S1, S2 normal, no murmur, rub or gallop.  Abdomen:   Soft, non-tender, non distended.  Extremities: Extremities normal, atraumatic, no cyanosis or edema  Pulses: 1+ and symmetric  Neurologic: Normal affect, no gross deficits.   Results for orders placed during the hospital encounter of 02/13/13 (from the past 48 hour(s))  ETHANOL     Status: None   Collection Time    02/13/13  4:57 PM      Result Value Range   Alcohol, Ethyl (B) <11  0 - 11 mg/dL   Comment:            LOWEST DETECTABLE LIMIT FOR     SERUM ALCOHOL IS 11 mg/dL     FOR MEDICAL PURPOSES ONLY  PROTIME-INR     Status: None   Collection Time    02/13/13  4:57 PM      Result Value Range   Prothrombin Time 12.6  11.6 - 15.2 seconds   INR 0.96  0.00 - 1.49  APTT     Status: None   Collection Time    02/13/13  4:57 PM      Result Value Range   aPTT 31  24 - 37 seconds  CBC  Status: None   Collection Time    02/13/13  4:57 PM      Result Value Range   WBC 8.6  4.0 - 10.5 K/uL   RBC 4.32  4.22 - 5.81 MIL/uL   Hemoglobin 14.0  13.0 - 17.0 g/dL   HCT 16.1  09.6 - 04.5 %   MCV 93.5  78.0 - 100.0 fL   MCH 32.4  26.0 - 34.0 pg   MCHC 34.7  30.0 - 36.0 g/dL   RDW 40.9  81.1 - 91.4 %   Platelets 197  150 - 400 K/uL  DIFFERENTIAL     Status: None   Collection Time    02/13/13  4:57 PM      Result Value Range   Neutrophils Relative % 51  43 - 77 %   Neutro Abs 4.4  1.7 - 7.7 K/uL   Lymphocytes Relative 36  12 - 46 %   Lymphs Abs 3.1  0.7 - 4.0 K/uL   Monocytes Relative 10  3 - 12 %   Monocytes Absolute 0.9  0.1 - 1.0 K/uL   Eosinophils Relative 2  0 - 5 %   Eosinophils Absolute 0.2  0.0 - 0.7 K/uL   Basophils Relative 1  0 - 1 %   Basophils Absolute 0.1  0.0 - 0.1 K/uL  COMPREHENSIVE METABOLIC PANEL     Status: Abnormal   Collection Time    02/13/13  4:57 PM      Result Value Range   Sodium 141  135 - 145 mEq/L   Potassium 3.9  3.5 - 5.1 mEq/L    Chloride 106  96 - 112 mEq/L   CO2 26  19 - 32 mEq/L   Glucose, Bld 101 (*) 70 - 99 mg/dL   BUN 16  6 - 23 mg/dL   Creatinine, Ser 7.82 (*) 0.50 - 1.35 mg/dL   Calcium 9.5  8.4 - 95.6 mg/dL   Total Protein 7.0  6.0 - 8.3 g/dL   Albumin 3.8  3.5 - 5.2 g/dL   AST 19  0 - 37 U/L   ALT 13  0 - 53 U/L   Alkaline Phosphatase 84  39 - 117 U/L   Total Bilirubin 0.5  0.3 - 1.2 mg/dL   GFR calc non Af Amer 36 (*) >90 mL/min   GFR calc Af Amer 41 (*) >90 mL/min   Comment: (NOTE)     The eGFR has been calculated using the CKD EPI equation.     This calculation has not been validated in all clinical situations.     eGFR's persistently <90 mL/min signify possible Chronic Kidney     Disease.  TROPONIN I     Status: None   Collection Time    02/13/13  4:57 PM      Result Value Range   Troponin I <0.30  <0.30 ng/mL   Comment:            Due to the release kinetics of cTnI,     a negative result within the first hours     of the onset of symptoms does not rule out     myocardial infarction with certainty.     If myocardial infarction is still suspected,     repeat the test at appropriate intervals.  POCT I-STAT TROPONIN I     Status: None   Collection Time    02/13/13  4:59 PM      Result Value Range  Troponin i, poc 0.01  0.00 - 0.08 ng/mL   Comment 3            Comment: Due to the release kinetics of cTnI,     a negative result within the first hours     of the onset of symptoms does not rule out     myocardial infarction with certainty.     If myocardial infarction is still suspected,     repeat the test at appropriate intervals.  POCT I-STAT, CHEM 8     Status: Abnormal   Collection Time    02/13/13  5:01 PM      Result Value Range   Sodium 144  135 - 145 mEq/L   Potassium 3.9  3.5 - 5.1 mEq/L   Chloride 108  96 - 112 mEq/L   BUN 16  6 - 23 mg/dL   Creatinine, Ser 9.60 (*) 0.50 - 1.35 mg/dL   Glucose, Bld 96  70 - 99 mg/dL   Calcium, Ion 4.54  0.98 - 1.30 mmol/L   TCO2 24  0  - 100 mmol/L   Hemoglobin 15.0  13.0 - 17.0 g/dL   HCT 11.9  14.7 - 82.9 %  GLUCOSE, CAPILLARY     Status: None   Collection Time    02/13/13  5:43 PM      Result Value Range   Glucose-Capillary 95  70 - 99 mg/dL   Comment 1 Notify RN     Comment 2 Documented in Chart    URINE RAPID DRUG SCREEN (HOSP PERFORMED)     Status: None   Collection Time    02/13/13  5:51 PM      Result Value Range   Opiates NONE DETECTED  NONE DETECTED   Cocaine NONE DETECTED  NONE DETECTED   Benzodiazepines NONE DETECTED  NONE DETECTED   Amphetamines NONE DETECTED  NONE DETECTED   Tetrahydrocannabinol NONE DETECTED  NONE DETECTED   Barbiturates NONE DETECTED  NONE DETECTED   Comment:            DRUG SCREEN FOR MEDICAL PURPOSES     ONLY.  IF CONFIRMATION IS NEEDED     FOR ANY PURPOSE, NOTIFY LAB     WITHIN 5 DAYS.                LOWEST DETECTABLE LIMITS     FOR URINE DRUG SCREEN     Drug Class       Cutoff (ng/mL)     Amphetamine      1000     Barbiturate      200     Benzodiazepine   200     Tricyclics       300     Opiates          300     Cocaine          300     THC              50  URINALYSIS, ROUTINE W REFLEX MICROSCOPIC     Status: None   Collection Time    02/13/13  5:51 PM      Result Value Range   Color, Urine YELLOW  YELLOW   APPearance CLEAR  CLEAR   Specific Gravity, Urine 1.005  1.005 - 1.030   pH 5.5  5.0 - 8.0   Glucose, UA NEGATIVE  NEGATIVE mg/dL   Hgb urine dipstick NEGATIVE  NEGATIVE   Bilirubin Urine  NEGATIVE  NEGATIVE   Ketones, ur NEGATIVE  NEGATIVE mg/dL   Protein, ur NEGATIVE  NEGATIVE mg/dL   Urobilinogen, UA 0.2  0.0 - 1.0 mg/dL   Nitrite NEGATIVE  NEGATIVE   Leukocytes, UA NEGATIVE  NEGATIVE   Comment: MICROSCOPIC NOT DONE ON URINES WITH NEGATIVE PROTEIN, BLOOD, LEUKOCYTES, NITRITE, OR GLUCOSE <1000 mg/dL.  TSH     Status: None   Collection Time    02/14/13  5:50 AM      Result Value Range   TSH 1.122  0.350 - 4.500 uIU/mL   Comment: Performed at [facility]  COMPREHENSIVE METABOLIC PANEL     Status: Abnormal   Collection Time    02/14/13  5:50 AM      Result Value Range   Sodium 141  135 - 145 mEq/L   Potassium 3.7  3.5 - 5.1 mEq/L   Chloride 107  96 - 112 mEq/L   CO2 26  19 - 32 mEq/L   Glucose, Bld 103 (*) 70 - 99 mg/dL   BUN 14  6 - 23 mg/dL   Creatinine, Ser 4.54 (*) 0.50 - 1.35 mg/dL   Calcium 8.8  8.4 - 09.8 mg/dL   Total Protein 6.1  6.0 - 8.3 g/dL   Albumin 3.2 (*) 3.5 - 5.2 g/dL   AST 17  0 - 37 U/L   ALT 12  0 - 53 U/L   Alkaline Phosphatase 68  39 - 117 U/L   Total Bilirubin 0.6  0.3 - 1.2 mg/dL   GFR calc non Af Amer 42 (*) >90 mL/min   GFR calc Af Amer 48 (*) >90 mL/min   Comment: (NOTE)     The eGFR has been calculated using the CKD EPI equation.     This calculation has not been validated in all clinical situations.     eGFR's persistently <90 mL/min signify possible Chronic Kidney     Disease.  CBC WITH DIFFERENTIAL     Status: Abnormal   Collection Time    02/14/13  5:50 AM      Result Value Range   WBC 8.7  4.0 - 10.5 K/uL   RBC 4.14 (*) 4.22 - 5.81 MIL/uL   Hemoglobin 13.2  13.0 - 17.0 g/dL   HCT 11.9 (*) 14.7 - 82.9 %   MCV 93.0  78.0 - 100.0 fL   MCH 31.9  26.0 - 34.0 pg   MCHC 34.3  30.0 - 36.0 g/dL   RDW 56.2  13.0 - 86.5 %   Platelets 167  150 - 400 K/uL   Neutrophils Relative % 58  43 - 77 %   Neutro Abs 5.0  1.7 - 7.7 K/uL   Lymphocytes Relative 29  12 - 46 %   Lymphs Abs 2.5  0.7 - 4.0 K/uL   Monocytes Relative 11  3 - 12 %   Monocytes Absolute 1.0  0.1 - 1.0 K/uL   Eosinophils Relative 2  0 - 5 %   Eosinophils Absolute 0.2  0.0 - 0.7 K/uL   Basophils Relative 0  0 - 1 %   Basophils Absolute 0.0  0.0 - 0.1 K/uL  HEMOGLOBIN A1C     Status: None   Collection Time    02/14/13  5:50 AM      Result Value Range   Hemoglobin A1C 5.6  <5.7 %   Comment: (NOTE)  According to the ADA Clinical Practice  Recommendations for 2011, when     HbA1c is used as a screening test:      >=6.5%   Diagnostic of Diabetes Mellitus               (if abnormal result is confirmed)     5.7-6.4%   Increased risk of developing Diabetes Mellitus     References:Diagnosis and Classification of Diabetes Mellitus,Diabetes     Care,2011,34(Suppl 1):S62-S69 and Standards of Medical Care in             Diabetes - 2011,Diabetes Care,2011,34 (Suppl 1):S11-S61.   Mean Plasma Glucose 114  <117 mg/dL   Comment: Performed at [facility]  LIPID PANEL     Status: Abnormal   Collection Time    02/14/13  5:50 AM      Result Value Range   Cholesterol 152  0 - 200 mg/dL   Triglycerides 161  <096 mg/dL   HDL 36 (*) >04 mg/dL   Total CHOL/HDL Ratio 4.2     VLDL 20  0 - 40 mg/dL   LDL Cholesterol 96  0 - 99 mg/dL   Comment:            Total Cholesterol/HDL:CHD Risk     Coronary Heart Disease Risk Table                         Men   Women      1/2 Average Risk   3.4   3.3      Average Risk       5.0   4.4      2 X Average Risk   9.6   7.1      3 X Average Risk  23.4   11.0                Use the calculated Patient Ratio     above and the CHD Risk Table     to determine the patient's CHD Risk.                ATP III CLASSIFICATION (LDL):      <100     mg/dL   Optimal      540-981  mg/dL   Near or Above                        Optimal      130-159  mg/dL   Borderline      191-478  mg/dL   High      >295     mg/dL   Very High   Dg Chest 2 View  02/13/2013   CLINICAL DATA:  Stroke.  EXAM: CHEST  2 VIEW  COMPARISON:  01/02/2011.  FINDINGS: Borderline cardiomegaly. Both lungs are clear. No effusion or pneumothorax. The visualized skeletal structures are unremarkable.  IMPRESSION: No active cardiopulmonary disease.   Electronically Signed   By: Tiburcio Pea   On: 02/13/2013 23:47   Ct Head Wo Contrast  02/13/2013   CLINICAL DATA:  79 year old male code stroke with acute left side weakness.  EXAM: CT HEAD  WITHOUT CONTRAST  TECHNIQUE: Contiguous axial images were obtained from the base of the skull through the vertex without intravenous contrast.  COMPARISON:  05/18/2008.  FINDINGS: Visualized paranasal sinuses and mastoids are clear. Postoperative changes to the orbits. Negative scalp soft tissues. No acute osseous abnormality identified.  Dominant distal left vertebral artery re- identified. Calcified atherosclerosis at the skull base. Mildly decreased cerebral volume. No ventriculomegaly. No midline shift, mass effect, or evidence of intracranial mass lesion. No acute intracranial hemorrhage. Questionable chronic and unchanged small left anterior frontal extra-axial collection. No suspicious intracranial vascular hyperdensity. No evidence of cortically based acute infarction identified.  IMPRESSION: No acute intracranial abnormality with Stable non contrast CT appearance of the brain except for mild generalized volume loss since 2009.  Study discussed by telephone with Dr. Pricilla Loveless on 02/13/2013 at 16:51 .   Electronically Signed   By: Augusto Gamble M.D.   On: 02/13/2013 16:52    Assessment/Plan Left sided weakness. MRI/MRA unable to be performed secondary to claustrophobia.  Request for conscious sedation MRI/MRA brain today. Risks and Benefits discussed with the patient and his wife. His wife has signed the consent. All of the patient's questions were answered, patient and his wife are agreeable to proceed. Consent signed and in chart.   Pattricia Boss D PA-C 02/15/2013, 11:50 AM

## 2013-02-16 ENCOUNTER — Encounter (HOSPITAL_COMMUNITY): Payer: Self-pay | Admitting: *Deleted

## 2013-02-16 ENCOUNTER — Encounter (HOSPITAL_COMMUNITY)
Admission: EM | Disposition: A | Discharge status: Home / Self Care | Payer: Self-pay | Source: Home / Self Care | Attending: Internal Medicine

## 2013-02-16 DIAGNOSIS — M7989 Other specified soft tissue disorders: Secondary | ICD-10-CM

## 2013-02-16 DIAGNOSIS — I059 Rheumatic mitral valve disease, unspecified: Secondary | ICD-10-CM

## 2013-02-16 HISTORY — PX: TEE WITHOUT CARDIOVERSION: SHX5443

## 2013-02-16 SURGERY — ECHOCARDIOGRAM, TRANSESOPHAGEAL
Anesthesia: Moderate Sedation

## 2013-02-16 MED ORDER — SODIUM CHLORIDE 0.9 % IV SOLN
INTRAVENOUS | Status: DC
  Administered 2013-02-16: 500 mL via INTRAVENOUS

## 2013-02-16 MED ORDER — AMLODIPINE BESYLATE 2.5 MG PO TABS: 5.0000 mg | ORAL_TABLET | Freq: Every day | ORAL | Status: DC

## 2013-02-16 MED ORDER — BUTAMBEN-TETRACAINE-BENZOCAINE 2-2-14 % EX AERO
INHALATION_SPRAY | CUTANEOUS | Status: DC | PRN
  Administered 2013-02-16: 1 via TOPICAL

## 2013-02-16 MED ORDER — MIDAZOLAM HCL 5 MG/ML IJ SOLN
INTRAMUSCULAR | Status: AC
  Filled 2013-02-16: qty 1

## 2013-02-16 MED ORDER — ALLOPURINOL 300 MG PO TABS: 150.0000 mg | ORAL_TABLET | Freq: Every day | ORAL | Status: DC

## 2013-02-16 MED ORDER — ASPIRIN 325 MG PO TABS: 325.0000 mg | ORAL_TABLET | Freq: Every day | ORAL | Status: DC

## 2013-02-16 MED ORDER — ASPIRIN 81 MG PO TBEC: 81.0000 mg | DELAYED_RELEASE_TABLET | Freq: Every day | ORAL | Status: AC

## 2013-02-16 MED ORDER — FLUTICASONE PROPIONATE 50 MCG/ACT NA SUSP
1.0000 | Freq: Every day | NASAL | Status: DC
  Filled 2013-02-16 (×2): qty 16

## 2013-02-16 MED ORDER — FENTANYL CITRATE 0.05 MG/ML IJ SOLN
INTRAMUSCULAR | Status: AC
  Filled 2013-02-16: qty 2

## 2013-02-16 MED ORDER — MIDAZOLAM HCL 10 MG/2ML IJ SOLN
INTRAMUSCULAR | Status: DC | PRN
  Administered 2013-02-16: 2 mg via INTRAVENOUS
  Administered 2013-02-16: 1 mg via INTRAVENOUS

## 2013-02-16 MED ORDER — AMLODIPINE BESYLATE 5 MG PO TABS
7.5000 mg | ORAL_TABLET | Freq: Every day | ORAL | Status: DC
  Administered 2013-02-16: 18:00:00 7.5 mg via ORAL
  Filled 2013-02-16 (×2): qty 1

## 2013-02-16 NOTE — Progress Notes (Signed)
VASCULAR LAB PRELIMINARY  PRELIMINARY  PRELIMINARY  PRELIMINARY  Bilateral lower extremity venous duplex completed.    Preliminary report:  Bilateral:  No evidence of DVT, superficial thrombosis, or Baker's Cyst.   Dejaun Vidrio, RVS 02/16/2013, 6:19 PM

## 2013-02-16 NOTE — Progress Notes (Signed)
OT Cancellation Note  Patient Details Name: PASQUALINO WITHERSPOON MRN: [id number] DOB: 1929-04-26   Cancelled Treatment:    Reason Eval/Treat Not Completed: Patient at procedure or test/ unavailable  Harolyn Rutherford Pager: [phone]  02/16/2013, 11:31 AM

## 2013-02-16 NOTE — Progress Notes (Signed)
PT discharged with family members. Discharge information reviewed. Verbal understanding noted.

## 2013-02-16 NOTE — Progress Notes (Signed)
UR COMPLETED  

## 2013-02-16 NOTE — Progress Notes (Signed)
SLP SLP screen note, spoke to spouse who reports no change in pt's communication with this event.  Pt with known h/o dementia.  Spouse reports she will continue to provide 24/7 supervision.  SLP to sign off.  Please reorder if desire. Donavan Burnet, MS [facility] SLP [phone]

## 2013-02-16 NOTE — Discharge Summary (Signed)
Physician Discharge Summary  Donald Mcconnell ZOX:[id number] DOB: [birth year] DOA: 02/13/2013  PCP: Lorenda Peck, MD  Admit date: 02/13/2013 Discharge date: 02/16/2013  Time spent: > 35 minutes  minutes  Recommendations for Outpatient Follow-up:   -f/u with PCP in 1-2 weeks -f/u with Neurology in 1 month   Discharge Diagnoses:  Principal Problem:   CVA (cerebral vascular accident) Active Problems:   HTN (hypertension)   Renal failure   Hypothyroidism   History of gout   Discharge Condition: stable   Diet recommendation: heart heal;thy   Filed Weights   02/13/13 1616 02/13/13 2207  Weight: 80.74 kg (178 lb) 82.283 kg (181 lb 6.4 oz)    History of present illness:  80 y/o male with PMH of HTN, Hypothyroidism, gout, alzheimer's dementia presented to [facility] with left facial numbness and left upper extremity weakness; was transferred to [facility] for further management. On-call neurologist Dr. Cyril Mourning has already evaluated the patient and at this time as requested further stroke workup and found patient was outside TPA window period.   Hospital course   1. Acute CVA; left arm monoparesis, left face droop, numbness left face-arm, and dysarthria; MRI: Acute infarction in the right frontoparietal junction region,  -echo-LVEF 55%, doppler< 39%, HA1C-5.6; TEE: no thrombus, but small PFO; [place] legs neg for DVT;  -PT eval: outpatient PT;  -cont asa, statin; BP monitor  2. Dementia with confusion at baseline; cont monitoring  3. HTN cont atenolol, added low dose amlodipine; additional dose 7.5 on 9/16  4. Hypothyroidism, cont synthroid  5. CKD III; cont monitoring;  -decrease allopurinol dose   Patient is being d/c home with outpatient PT/OT   Procedures: MRI, CT head as above  -TEE resulted pend   Consultations:  Neurology   Discharge Exam: Filed Vitals:   02/16/13 0900  BP: 165/71  Pulse: 64  Temp: 97.3 F (36.3 C)  Resp: 18    General: alert, oriented   Cardiovascular: S1,s2 rrr Respiratory: CTA BL   Discharge Instructions  Discharge Orders   Future Orders Complete By Expires   Diet - low sodium heart healthy  As directed    Discharge instructions  As directed    Comments:     -Please follow up with primary care doctor in 1-2 weeks  -follow up with neurologist in 1 month   Increase activity slowly  As directed        Medication List         allopurinol 300 MG tablet  Commonly known as:  ZYLOPRIM  Take 0.5 tablets (150 mg total) by mouth daily.     amLODipine 2.5 MG tablet  Commonly known as:  NORVASC  Take 2 tablets (5 mg total) by mouth daily.     aspirin 81 MG EC tablet  Take 1 tablet (81 mg total) by mouth daily.     atenolol 50 MG tablet  Commonly known as:  TENORMIN  Take 50 mg by mouth daily.     levothyroxine 100 MCG tablet  Commonly known as:  SYNTHROID, LEVOTHROID  Take 100 mcg by mouth daily before breakfast.       No Known Allergies     Follow-up Information   Follow up with ROBERTS, Vernie Ammons, MD In 1 week.   Specialty:  Internal Medicine   Contact information:   [address] [place] [address] [phone]       Follow up with Gates Rigg, MD In 1 month.  Specialties:  Neurology, Radiology   Contact information:   [address] [place] [address] [phone]        The results of significant diagnostics from this hospitalization (including imaging, microbiology, ancillary and laboratory) are listed below for reference.    Significant Diagnostic Studies: Dg Chest 2 View  02/13/2013   CLINICAL DATA:  Stroke.  EXAM: CHEST  2 VIEW  COMPARISON:  01/02/2011.  FINDINGS: Borderline cardiomegaly. Both lungs are clear. No effusion or pneumothorax. The visualized skeletal structures are unremarkable.  IMPRESSION: No active cardiopulmonary disease.   Electronically Signed   By: Tiburcio Pea   On: 02/13/2013 23:47   Ct Head Wo Contrast  02/13/2013    CLINICAL DATA:  79 year old male code stroke with acute left side weakness.  EXAM: CT HEAD WITHOUT CONTRAST  TECHNIQUE: Contiguous axial images were obtained from the base of the skull through the vertex without intravenous contrast.  COMPARISON:  05/18/2008.  FINDINGS: Visualized paranasal sinuses and mastoids are clear. Postoperative changes to the orbits. Negative scalp soft tissues. No acute osseous abnormality identified.  Dominant distal left vertebral artery re- identified. Calcified atherosclerosis at the skull base. Mildly decreased cerebral volume. No ventriculomegaly. No midline shift, mass effect, or evidence of intracranial mass lesion. No acute intracranial hemorrhage. Questionable chronic and unchanged small left anterior frontal extra-axial collection. No suspicious intracranial vascular hyperdensity. No evidence of cortically based acute infarction identified.  IMPRESSION: No acute intracranial abnormality with Stable non contrast CT appearance of the brain except for mild generalized volume loss since 2009.  Study discussed by telephone with Dr. Pricilla Loveless on 02/13/2013 at 16:51 .   Electronically Signed   By: Augusto Gamble M.D.   On: 02/13/2013 16:52   Mr Angiogram Head Wo Contrast  02/15/2013   CLINICAL DATA:  Left hemi pertussis. Stroke.  EXAM: MRI HEAD WITHOUT CONTRAST  MRA HEAD WITHOUT CONTRAST  TECHNIQUE: Multiplanar, multiecho pulse sequences of the brain and surrounding structures were obtained without intravenous contrast. Angiographic images of the head were obtained using MRA technique without contrast.  COMPARISON:  Head CT 02/13/2013  FINDINGS: MRI HEAD FINDINGS  There is acute infarction in the right frontoparietal junction region, possibly affecting the precentral gyrus. There is mild swelling but no hemorrhage or mass effect. No other acute infarction.  The brainstem and cerebellum are unremarkable. There are mild chronic small vessel changes within the hemispheric deep white  matter. No mass lesion, hydrocephalus or extra-axial collection. No pituitary mass. No inflammatory sinus disease. No skull or skullbase lesion.  MRA HEAD FINDINGS  Both internal carotid arteries are widely patent into the brain. The anterior and middle cerebral vessels are patent without proximal stenosis, aneurysm or vascular malformation.  The right vertebral artery appears to terminate in plaque of. The left vertebral artery is a large vessel widely patent to the basilar. No basilar stenosis. Posterior circulation branch vessels are patent with the right PCA taking a fetal origin from the anterior circulation. Distal branch vessels do show some atherosclerotic irregularity.  IMPRESSION: MRI HEAD IMPRESSION  Acute infarction in the right frontoparietal junction region, possibly affecting the precentral gyrus. No hemorrhage or mass effect/ significant swelling.  MRA HEAD IMPRESSION  No major vessel occlusion or correctable proximal stenosis. Specifically, no focal finding in the right MCA territory. More distal branch vessels do show mild atherosclerotic irregularity diffusely.   Electronically Signed   By: Paulina Fusi M.D.   On: 02/15/2013 14:12   Mr Brain Wo Contrast  02/15/2013   CLINICAL DATA:  Left hemi pertussis. Stroke.  EXAM: MRI HEAD WITHOUT CONTRAST  MRA HEAD WITHOUT CONTRAST  TECHNIQUE: Multiplanar, multiecho pulse sequences of the brain and surrounding structures were obtained without intravenous contrast. Angiographic images of the head were obtained using MRA technique without contrast.  COMPARISON:  Head CT 02/13/2013  FINDINGS: MRI HEAD FINDINGS  There is acute infarction in the right frontoparietal junction region, possibly affecting the precentral gyrus. There is mild swelling but no hemorrhage or mass effect. No other acute infarction.  The brainstem and cerebellum are unremarkable. There are mild chronic small vessel changes within the hemispheric deep white matter. No mass lesion,  hydrocephalus or extra-axial collection. No pituitary mass. No inflammatory sinus disease. No skull or skullbase lesion.  MRA HEAD FINDINGS  Both internal carotid arteries are widely patent into the brain. The anterior and middle cerebral vessels are patent without proximal stenosis, aneurysm or vascular malformation.  The right vertebral artery appears to terminate in plaque of. The left vertebral artery is a large vessel widely patent to the basilar. No basilar stenosis. Posterior circulation branch vessels are patent with the right PCA taking a fetal origin from the anterior circulation. Distal branch vessels do show some atherosclerotic irregularity.  IMPRESSION: MRI HEAD IMPRESSION  Acute infarction in the right frontoparietal junction region, possibly affecting the precentral gyrus. No hemorrhage or mass effect/ significant swelling.  MRA HEAD IMPRESSION  No major vessel occlusion or correctable proximal stenosis. Specifically, no focal finding in the right MCA territory. More distal branch vessels do show mild atherosclerotic irregularity diffusely.   Electronically Signed   By: Paulina Fusi M.D.   On: 02/15/2013 14:12    Microbiology: No results found for this or any previous visit (from the past 240 hour(s)).   Labs: Basic Metabolic Panel:  Recent Labs Lab 02/13/13 1657 02/13/13 1701 02/14/13 0550  NA 141 144 141  K 3.9 3.9 3.7  CL 106 108 107  CO2 26  --  26  GLUCOSE 101* 96 103*  BUN 16 16 14   CREATININE 1.71* 1.70* 1.50*  CALCIUM 9.5  --  8.8   Liver Function Tests:  Recent Labs Lab 02/13/13 1657 02/14/13 0550  AST 19 17  ALT 13 12  ALKPHOS 84 68  BILITOT 0.5 0.6  PROT 7.0 6.1  ALBUMIN 3.8 3.2*   No results found for this basename: LIPASE, AMYLASE,  in the last 168 hours No results found for this basename: AMMONIA,  in the last 168 hours CBC:  Recent Labs Lab 02/13/13 1657 02/13/13 1701 02/14/13 0550  WBC 8.6  --  8.7  NEUTROABS 4.4  --  5.0  HGB 14.0 15.0  13.2  HCT 40.4 44.0 38.5*  MCV 93.5  --  93.0  PLT 197  --  167   Cardiac Enzymes:  Recent Labs Lab 02/13/13 1657  TROPONINI <0.30   BNP: BNP (last 3 results) No results found for this basename: PROBNP,  in the last 8760 hours CBG:  Recent Labs Lab 02/13/13 1743  GLUCAP 95       Signed:  Jonette Mate N  [facility] 02/16/2013, 10:22 AM

## 2013-02-16 NOTE — Interval H&P Note (Signed)
History and Physical Interval Note:  02/16/2013 9:44 AM  Donald Mcconnell  has presented today for surgery, with the diagnosis of stroke  The various methods of treatment have been discussed with the patient and family. After consideration of risks, benefits and other options for treatment, the patient has consented to  Procedure(s): TRANSESOPHAGEAL ECHOCARDIOGRAM (TEE) (N/A) as a surgical intervention .  The patient's history has been reviewed, patient examined, no change in status, stable for surgery.  I have reviewed the patient's chart and labs.  Questions were answered to the patient's satisfaction.     Charlton Haws

## 2013-02-16 NOTE — Progress Notes (Signed)
[facility] PROGRESS NOTE  Donald Mcconnell AVW:[id number] DOB: Jul 02, 1928 DOA: 02/13/2013 PCP: Lorenda Peck, MD  Assessment/Plan:  79 y/o male with PMH of HTN, Hypothyroidism, gout, alzheimer's dementia presented to [facility] with left facial numbness and left upper extremity weakness; was transferred to [facility] for further management. On-call neurologist Dr. Cyril Mourning has already evaluated the patient and at this time as requested further stroke workup and found patient was outside TPA window period.    1. Acute CVA; left arm monoparesis, left face droop, numbness left face-arm, and dysarthria; MRI: Acute infarction in the right frontoparietal junction region,  -echo-LVEF 55%, doppler< 39%, HA1C-5.6; TEE: no thrombus, but small PFO; will need [place] legs r/o DVT--pend;  -PT eval: outpatient PT;  -cont asa, statin; BP monitor  2. Dementia with confusion at baseline; cont monitoring  3. HTN cont atenolol, added low dose amlodipine; additional dose 7.5 on 9/16 4. Hypothyroidism, cont synthroid  5. CKD III; cont monitoring;  -decrease allopurinol dose   Patient plan to d/c home with outpatient PT/OT if DVT negative       Code Status: full  Family Communication: son, wife at the bedside  (indicate person spoken with, relationship, and if by phone, the number) Disposition Plan: pend PT eval    Consultants:  neurology   Procedures:  CT   Antibiotics:  No  (indicate start date, and stop date if known)  HPI/Subjective: Sleeping, follow commands when awake   Objective: Filed Vitals:   02/16/13 1426  BP: 176/83  Pulse: 62  Temp: 97.4 F (36.3 C)  Resp: 20    Intake/Output Summary (Last 24 hours) at 02/16/13 1820 Last data filed at 02/16/13 1300  Gross per 24 hour  Intake    560 ml  Output      0 ml  Net    560 ml   Filed Weights   02/13/13 1616 02/13/13 2207  Weight: 80.74 kg (178 lb) 82.283 kg (181 lb 6.4 oz)    Exam:   General:  No acute distress    Cardiovascular: s1, s2 rrr  Respiratory: cta bl   Abdomen: soft, nt, nd   Musculoskeletal: no le edema    Data Reviewed: Basic Metabolic Panel:  Recent Labs Lab 02/13/13 1657 02/13/13 1701 02/14/13 0550  NA 141 144 141  K 3.9 3.9 3.7  CL 106 108 107  CO2 26  --  26  GLUCOSE 101* 96 103*  BUN 16 16 14   CREATININE 1.71* 1.70* 1.50*  CALCIUM 9.5  --  8.8   Liver Function Tests:  Recent Labs Lab 02/13/13 1657 02/14/13 0550  AST 19 17  ALT 13 12  ALKPHOS 84 68  BILITOT 0.5 0.6  PROT 7.0 6.1  ALBUMIN 3.8 3.2*   No results found for this basename: LIPASE, AMYLASE,  in the last 168 hours No results found for this basename: AMMONIA,  in the last 168 hours CBC:  Recent Labs Lab 02/13/13 1657 02/13/13 1701 02/14/13 0550  WBC 8.6  --  8.7  NEUTROABS 4.4  --  5.0  HGB 14.0 15.0 13.2  HCT 40.4 44.0 38.5*  MCV 93.5  --  93.0  PLT 197  --  167   Cardiac Enzymes:  Recent Labs Lab 02/13/13 1657  TROPONINI <0.30   BNP (last 3 results) No results found for this basename: PROBNP,  in the last 8760 hours CBG:  Recent Labs Lab 02/13/13 1743  GLUCAP 95    No results found for  this or any previous visit (from the past 240 hour(s)).   Studies: Mr Angiogram Head Wo Contrast  02/15/2013   CLINICAL DATA:  Left hemi pertussis. Stroke.  EXAM: MRI HEAD WITHOUT CONTRAST  MRA HEAD WITHOUT CONTRAST  TECHNIQUE: Multiplanar, multiecho pulse sequences of the brain and surrounding structures were obtained without intravenous contrast. Angiographic images of the head were obtained using MRA technique without contrast.  COMPARISON:  Head CT 02/13/2013  FINDINGS: MRI HEAD FINDINGS  There is acute infarction in the right frontoparietal junction region, possibly affecting the precentral gyrus. There is mild swelling but no hemorrhage or mass effect. No other acute infarction.  The brainstem and cerebellum are unremarkable. There are mild chronic small vessel changes within the  hemispheric deep white matter. No mass lesion, hydrocephalus or extra-axial collection. No pituitary mass. No inflammatory sinus disease. No skull or skullbase lesion.  MRA HEAD FINDINGS  Both internal carotid arteries are widely patent into the brain. The anterior and middle cerebral vessels are patent without proximal stenosis, aneurysm or vascular malformation.  The right vertebral artery appears to terminate in plaque of. The left vertebral artery is a large vessel widely patent to the basilar. No basilar stenosis. Posterior circulation branch vessels are patent with the right PCA taking a fetal origin from the anterior circulation. Distal branch vessels do show some atherosclerotic irregularity.  IMPRESSION: MRI HEAD IMPRESSION  Acute infarction in the right frontoparietal junction region, possibly affecting the precentral gyrus. No hemorrhage or mass effect/ significant swelling.  MRA HEAD IMPRESSION  No major vessel occlusion or correctable proximal stenosis. Specifically, no focal finding in the right MCA territory. More distal branch vessels do show mild atherosclerotic irregularity diffusely.   Electronically Signed   By: Paulina Fusi M.D.   On: 02/15/2013 14:12   Mr Brain Wo Contrast  02/15/2013   CLINICAL DATA:  Left hemi pertussis. Stroke.  EXAM: MRI HEAD WITHOUT CONTRAST  MRA HEAD WITHOUT CONTRAST  TECHNIQUE: Multiplanar, multiecho pulse sequences of the brain and surrounding structures were obtained without intravenous contrast. Angiographic images of the head were obtained using MRA technique without contrast.  COMPARISON:  Head CT 02/13/2013  FINDINGS: MRI HEAD FINDINGS  There is acute infarction in the right frontoparietal junction region, possibly affecting the precentral gyrus. There is mild swelling but no hemorrhage or mass effect. No other acute infarction.  The brainstem and cerebellum are unremarkable. There are mild chronic small vessel changes within the hemispheric deep white matter.  No mass lesion, hydrocephalus or extra-axial collection. No pituitary mass. No inflammatory sinus disease. No skull or skullbase lesion.  MRA HEAD FINDINGS  Both internal carotid arteries are widely patent into the brain. The anterior and middle cerebral vessels are patent without proximal stenosis, aneurysm or vascular malformation.  The right vertebral artery appears to terminate in plaque of. The left vertebral artery is a large vessel widely patent to the basilar. No basilar stenosis. Posterior circulation branch vessels are patent with the right PCA taking a fetal origin from the anterior circulation. Distal branch vessels do show some atherosclerotic irregularity.  IMPRESSION: MRI HEAD IMPRESSION  Acute infarction in the right frontoparietal junction region, possibly affecting the precentral gyrus. No hemorrhage or mass effect/ significant swelling.  MRA HEAD IMPRESSION  No major vessel occlusion or correctable proximal stenosis. Specifically, no focal finding in the right MCA territory. More distal branch vessels do show mild atherosclerotic irregularity diffusely.   Electronically Signed   By: Paulina Fusi M.D.   On:  02/15/2013 14:12    Scheduled Meds: . allopurinol  100 mg Oral Daily  . amLODipine  7.5 mg Oral Daily  . aspirin EC  81 mg Oral Daily  . atenolol  50 mg Oral Daily  . enoxaparin (LOVENOX) injection  40 mg Subcutaneous Q24H  . feeding supplement  237 mL Oral BID BM  . fluticasone  1 spray Each Nare Daily  . levothyroxine  100 mcg Oral QAC breakfast   Continuous Infusions:    Principal Problem:   CVA (cerebral vascular accident) Active Problems:   HTN (hypertension)   Renal failure   Hypothyroidism   History of gout    Time spent: > 35 minutes     Esperanza Sheets  [facility] Pager [phone]. If 7PM-7AM, please contact night-coverage at [email or web address], password [facility] 02/16/2013, 6:20 PM  LOS: 3 days

## 2013-02-16 NOTE — CV Procedure (Signed)
TEE:  4 mg versed  Normal LV EF 60% Mild AR Mild MR No LAA thrombus Small PFO with positive bubble study Normal RA/RV No aortic debris Normal TV/PV No pericardial effusion  Donald Mcconnell

## 2013-02-16 NOTE — Progress Notes (Signed)
*  PRELIMINARY RESULTS* Echocardiogram Echocardiogram Transesophageal has been performed.  Donald Mcconnell 02/16/2013, 2:25 PM

## 2013-02-17 ENCOUNTER — Encounter (HOSPITAL_COMMUNITY): Payer: Self-pay | Admitting: Cardiovascular Disease

## 2014-06-12 ENCOUNTER — Emergency Department (HOSPITAL_COMMUNITY)
Admission: EM | Admit: 2014-06-12 | Discharge: 2014-06-13 | Disposition: A | Discharge status: Home / Self Care | Payer: Medicare PPO | Attending: Emergency Medicine | Admitting: Emergency Medicine

## 2014-06-12 DIAGNOSIS — N390 Urinary tract infection, site not specified: Secondary | ICD-10-CM | POA: Insufficient documentation

## 2014-06-12 DIAGNOSIS — Z7982 Long term (current) use of aspirin: Secondary | ICD-10-CM | POA: Insufficient documentation

## 2014-06-12 DIAGNOSIS — Z8739 Personal history of other diseases of the musculoskeletal system and connective tissue: Secondary | ICD-10-CM | POA: Insufficient documentation

## 2014-06-12 DIAGNOSIS — E079 Disorder of thyroid, unspecified: Secondary | ICD-10-CM | POA: Insufficient documentation

## 2014-06-12 DIAGNOSIS — G309 Alzheimer's disease, unspecified: Secondary | ICD-10-CM | POA: Insufficient documentation

## 2014-06-12 DIAGNOSIS — I10 Essential (primary) hypertension: Secondary | ICD-10-CM | POA: Insufficient documentation

## 2014-06-12 DIAGNOSIS — F0281 Dementia in other diseases classified elsewhere with behavioral disturbance: Secondary | ICD-10-CM | POA: Insufficient documentation

## 2014-06-12 DIAGNOSIS — R112 Nausea with vomiting, unspecified: Secondary | ICD-10-CM

## 2014-06-12 DIAGNOSIS — E860 Dehydration: Secondary | ICD-10-CM

## 2014-06-12 DIAGNOSIS — R197 Diarrhea, unspecified: Secondary | ICD-10-CM

## 2014-06-12 DIAGNOSIS — R319 Hematuria, unspecified: Secondary | ICD-10-CM

## 2014-06-13 ENCOUNTER — Encounter (HOSPITAL_COMMUNITY): Payer: Self-pay

## 2014-06-13 DIAGNOSIS — F0280 Dementia in other diseases classified elsewhere without behavioral disturbance: Secondary | ICD-10-CM | POA: Insufficient documentation

## 2014-06-13 DIAGNOSIS — G309 Alzheimer's disease, unspecified: Secondary | ICD-10-CM

## 2014-06-13 LAB — COMPREHENSIVE METABOLIC PANEL
ALT: 12 U/L (ref 0–53)
ANION GAP: 7 (ref 5–15)
AST: 16 U/L (ref 0–37)
Albumin: 3.5 g/dL (ref 3.5–5.2)
Alkaline Phosphatase: 55 U/L (ref 39–117)
BUN: 24 mg/dL — AB (ref 6–23)
CALCIUM: 8.7 mg/dL (ref 8.4–10.5)
CO2: 24 mmol/L (ref 19–32)
Chloride: 102 mEq/L (ref 96–112)
Creatinine, Ser: 1.65 mg/dL — ABNORMAL HIGH (ref 0.50–1.35)
GFR, EST AFRICAN AMERICAN: 43 mL/min — AB (ref >90)
GFR, EST NON AFRICAN AMERICAN: 37 mL/min — AB (ref >90)
Glucose, Bld: 144 mg/dL — ABNORMAL HIGH (ref 70–99)
Potassium: 3.8 mmol/L (ref 3.5–5.1)
Sodium: 133 mmol/L — ABNORMAL LOW (ref 135–145)
TOTAL PROTEIN: 6.6 g/dL (ref 6.0–8.3)
Total Bilirubin: 1.1 mg/dL (ref 0.3–1.2)

## 2014-06-13 LAB — URINE MICROSCOPIC-ADD ON

## 2014-06-13 LAB — URINALYSIS, ROUTINE W REFLEX MICROSCOPIC
BILIRUBIN URINE: NEGATIVE
GLUCOSE, UA: NEGATIVE mg/dL
KETONES UR: NEGATIVE mg/dL
Nitrite: POSITIVE — AB
Protein, ur: 30 mg/dL — AB
SPECIFIC GRAVITY, URINE: 1.01 (ref 1.005–1.030)
Urobilinogen, UA: 0.2 mg/dL (ref 0.0–1.0)
pH: 5.5 (ref 5.0–8.0)

## 2014-06-13 LAB — CBC WITH DIFFERENTIAL/PLATELET
BASOS ABS: 0 10*3/uL (ref 0.0–0.1)
BASOS PCT: 0 % (ref 0–1)
EOS PCT: 0 % (ref 0–5)
Eosinophils Absolute: 0 10*3/uL (ref 0.0–0.7)
HCT: 34.9 % — ABNORMAL LOW (ref 39.0–52.0)
HEMOGLOBIN: 11.8 g/dL — AB (ref 13.0–17.0)
LYMPHS ABS: 1.3 10*3/uL (ref 0.7–4.0)
LYMPHS PCT: 12 % (ref 12–46)
MCH: 31.9 pg (ref 26.0–34.0)
MCHC: 33.8 g/dL (ref 30.0–36.0)
MCV: 94.3 fL (ref 78.0–100.0)
MONOS PCT: 11 % (ref 3–12)
Monocytes Absolute: 1.2 10*3/uL — ABNORMAL HIGH (ref 0.1–1.0)
NEUTROS ABS: 8.4 10*3/uL — AB (ref 1.7–7.7)
NEUTROS PCT: 77 % (ref 43–77)
Platelets: 153 10*3/uL (ref 150–400)
RBC: 3.7 MIL/uL — ABNORMAL LOW (ref 4.22–5.81)
RDW: 15 % (ref 11.5–15.5)
WBC: 10.9 10*3/uL — AB (ref 4.0–10.5)

## 2014-06-13 MED ORDER — ONDANSETRON HCL 4 MG PO TABS: 4.0000 mg | ORAL_TABLET | Freq: Three times a day (TID) | ORAL | Status: DC | PRN

## 2014-06-13 MED ORDER — CEPHALEXIN 500 MG PO CAPS: 500.0000 mg | ORAL_CAPSULE | Freq: Three times a day (TID) | ORAL | Status: DC

## 2014-06-13 MED ORDER — SODIUM CHLORIDE 0.9 % IV BOLUS (SEPSIS)
500.0000 mL | Freq: Once | INTRAVENOUS | Status: AC
  Administered 2014-06-13: 500 mL via INTRAVENOUS

## 2014-06-13 MED ORDER — SODIUM CHLORIDE 0.9 % IV BOLUS (SEPSIS)
1000.0000 mL | Freq: Once | INTRAVENOUS | Status: AC
  Administered 2014-06-13: 1000 mL via INTRAVENOUS

## 2014-06-13 MED ORDER — ONDANSETRON HCL 4 MG/2ML IJ SOLN
4.0000 mg | Freq: Once | INTRAMUSCULAR | Status: AC
  Administered 2014-06-13: 4 mg via INTRAVENOUS
  Filled 2014-06-13: qty 2

## 2014-06-13 MED ORDER — CEFTRIAXONE SODIUM 1 G IJ SOLR
1.0000 g | Freq: Once | INTRAMUSCULAR | Status: AC
  Administered 2014-06-13: 1 g via INTRAVENOUS
  Filled 2014-06-13: qty 10

## 2014-06-13 NOTE — ED Provider Notes (Signed)
CSN: [id number]     Arrival date & time 06/12/14  2352 History  This chart was scribed for Ward Givens, MD by Richarda Overlie, ED Scribe. This patient was seen in room APA04/APA04 and the patient's care was started 12:40 AM.    Chief Complaint  Patient presents with  . Emesis  . Diarrhea   Patient is a 81 y.o. male presenting with diarrhea. The history is provided by the patient. No language interpreter was used.  Diarrhea Associated symptoms: vomiting   Associated symptoms: no abdominal pain    LEVEL 5 CAVEAT - DEMENTIA   HPI Comments: Donald Mcconnell is a 81 y.o. male with a history of HTN, hypothyroidism, Alzheimer's and gout who presents to the Emergency Department complaining of vomiting for the last 2 days. His wife reports that pt wants to sleep all the time, is dizzy and has vomited 5 or 6 times today and once yesterday since the onset of his symptoms. She reports pt has loose diarrhea with each episode of vomiting and decreased appetite as well. She reports the pt's 81 y.o. Randie Heinz grandson has been sick recently and had to come to the ED to receive  fluid but is unsure what his symptoms were. He denies abdominal pain. She is unsure of fever.    Pt reports NKDA.   PCP Dr Madelin Rear in [facility]   Past Medical History  Diagnosis Date  . Hypertension   . Thyroid disease     hypothyroidism  . Gout   . Hypothyroidism    Alzheimers   Past Surgical History  Procedure Laterality Date  . Colon surgery    . Shoulder surgery    . Eye surgery      cateract surgery  . Tee without cardioversion N/A 02/16/2013    Procedure: TRANSESOPHAGEAL ECHOCARDIOGRAM (TEE);  Surgeon: Wendall Stade, MD;  Location: [facility] ENDOSCOPY;  Service: Cardiovascular;  Laterality: N/A;   Family History  Problem Relation Age of Onset  . Prostate cancer Brother    History  Substance Use Topics  . Smoking status: Never Smoker   . Smokeless tobacco: Not on file  . Alcohol Use: No  lives at home Lives with  spouse  Review of Systems  Unable to perform ROS: Dementia  Constitutional: Positive for appetite change.  Gastrointestinal: Positive for nausea, vomiting and diarrhea. Negative for abdominal pain.  Neurological: Positive for dizziness.  All other systems reviewed and are negative.     Allergies  Review of patient's allergies indicates no known allergies.  Home Medications   Prior to Admission medications   Medication Sig Start Date End Date Taking? Authorizing Provider  allopurinol (ZYLOPRIM) 300 MG tablet Take 0.5 tablets (150 mg total) by mouth daily. 02/16/13  Yes Esperanza Sheets, MD  amLODipine (NORVASC) 2.5 MG tablet Take 2 tablets (5 mg total) by mouth daily. 02/16/13  Yes Esperanza Sheets, MD  aspirin EC 81 MG EC tablet Take 1 tablet (81 mg total) by mouth daily. 02/16/13  Yes Esperanza Sheets, MD  levothyroxine (SYNTHROID, LEVOTHROID) 100 MCG tablet Take 100 mcg by mouth daily before breakfast.   Yes Historical Provider, MD  atenolol (TENORMIN) 50 MG tablet Take 50 mg by mouth daily.    Historical Provider, MD   BP 130/55 mmHg  Pulse 76  Temp(Src) 98.3 F (36.8 C) (Oral)  Resp 24  Ht  (1.778 m)  Wt 175 lb (79.379 kg)  BMI 25.11 kg/m2  SpO2 98%  Vital  signs normal   Physical Exam  Constitutional: He appears well-developed and well-nourished.  Non-toxic appearance. He does not appear ill. No distress.  HENT:  Head: Normocephalic and atraumatic.  Right Ear: External ear normal.  Left Ear: External ear normal.  Nose: Nose normal. No mucosal edema or rhinorrhea.  Mouth/Throat: Oropharynx is clear and moist and mucous membranes are normal. No dental abscesses or uvula swelling.  Tongue is dry.  Eyes: Conjunctivae and EOM are normal. Pupils are equal, round, and reactive to light.  Neck: Normal range of motion and full passive range of motion without pain. Neck supple.  Cardiovascular: Normal rate, regular rhythm and normal heart sounds.  Exam reveals no gallop  and no friction rub.   No murmur heard. Pulmonary/Chest: Effort normal and breath sounds normal. No respiratory distress. He has no wheezes. He has no rhonchi. He has no rales. He exhibits no tenderness and no crepitus.  Abdominal: Soft. Normal appearance and bowel sounds are normal. He exhibits no distension. There is no tenderness. There is no rebound and no guarding.  Musculoskeletal: Normal range of motion. He exhibits no edema or tenderness.  Moves all extremities well.   Neurological: He is alert. He has normal strength. No cranial nerve deficit.  Cooperative, follows commands  Skin: Skin is warm, dry and intact. No rash noted. No erythema. No pallor.  Psychiatric: He has a normal mood and affect. His speech is normal and behavior is normal.  Nursing note and vitals reviewed.   ED Course  Procedures    Medications  sodium chloride 0.9 % bolus 1,000 mL (0 mLs Intravenous Stopped 06/13/14 0230)  ondansetron (ZOFRAN) injection 4 mg (4 mg Intravenous Given 06/13/14 0114)  sodium chloride 0.9 % bolus 500 mL (0 mLs Intravenous Stopped 06/13/14 0400)  cefTRIAXone (ROCEPHIN) 1 g in dextrose 5 % 50 mL IVPB (0 g Intravenous Stopped 06/13/14 0344)    DIAGNOSTIC STUDIES: Oxygen Saturation is 98% on RA, normal by my interpretation.    COORDINATION OF CARE: 12:45 AM Discussed treatment plan with pt at bedside and pt agreed to plan.  Recheck at 2:30 AM patient states he's feeling better. He is willing to try oral fluid challenge. He has a small amount of urinary output in a urinal at the bedside.  Patient was given Rocephin IV after his urinalysis results came back consistent with a urinary tract infection. Wife states he's just been laying around for a couple of days and this would certainly explain his lack of energy.   04:30 recheck, pt has been drinking fluids without vomiting. States he is feeling fine.   Labs Review Results for orders placed or performed during the hospital encounter of  06/12/14  Comprehensive metabolic panel  Result Value Ref Range   Sodium 133 (L) 135 - 145 mmol/L   Potassium 3.8 3.5 - 5.1 mmol/L   Chloride 102 96 - 112 mEq/L   CO2 24 19 - 32 mmol/L   Glucose, Bld 144 (H) 70 - 99 mg/dL   BUN 24 (H) 6 - 23 mg/dL   Creatinine, Ser 1.611.65 (H) 0.50 - 1.35 mg/dL   Calcium 8.7 8.4 - 09.610.5 mg/dL   Total Protein 6.6 6.0 - 8.3 g/dL   Albumin 3.5 3.5 - 5.2 g/dL   AST 16 0 - 37 U/L   ALT 12 0 - 53 U/L   Alkaline Phosphatase 55 39 - 117 U/L   Total Bilirubin 1.1 0.3 - 1.2 mg/dL   GFR calc non Af [place]  37 (L) >90 mL/min   GFR calc Af Amer 43 (L) >90 mL/min   Anion gap 7 5 - 15  CBC with Differential  Result Value Ref Range   WBC 10.9 (H) 4.0 - 10.5 K/uL   RBC 3.70 (L) 4.22 - 5.81 MIL/uL   Hemoglobin 11.8 (L) 13.0 - 17.0 g/dL   HCT 16.1 (L) 09.6 - 04.5 %   MCV 94.3 78.0 - 100.0 fL   MCH 31.9 26.0 - 34.0 pg   MCHC 33.8 30.0 - 36.0 g/dL   RDW 40.9 81.1 - 91.4 %   Platelets 153 150 - 400 K/uL   Neutrophils Relative % 77 43 - 77 %   Neutro Abs 8.4 (H) 1.7 - 7.7 K/uL   Lymphocytes Relative 12 12 - 46 %   Lymphs Abs 1.3 0.7 - 4.0 K/uL   Monocytes Relative 11 3 - 12 %   Monocytes Absolute 1.2 (H) 0.1 - 1.0 K/uL   Eosinophils Relative 0 0 - 5 %   Eosinophils Absolute 0.0 0.0 - 0.7 K/uL   Basophils Relative 0 0 - 1 %   Basophils Absolute 0.0 0.0 - 0.1 K/uL  Urinalysis, Routine w reflex microscopic  Result Value Ref Range   Color, Urine YELLOW YELLOW   APPearance CLEAR CLEAR   Specific Gravity, Urine 1.010 1.005 - 1.030   pH 5.5 5.0 - 8.0   Glucose, UA NEGATIVE NEGATIVE mg/dL   Hgb urine dipstick SMALL (A) NEGATIVE   Bilirubin Urine NEGATIVE NEGATIVE   Ketones, ur NEGATIVE NEGATIVE mg/dL   Protein, ur 30 (A) NEGATIVE mg/dL   Urobilinogen, UA 0.2 0.0 - 1.0 mg/dL   Nitrite POSITIVE (A) NEGATIVE   Leukocytes, UA SMALL (A) NEGATIVE  Urine microscopic-add on  Result Value Ref Range   Squamous Epithelial / LPF RARE RARE   WBC, UA 11-20 <3 WBC/hpf   RBC /  HPF 3-6 <3 RBC/hpf   Bacteria, UA MANY (A) RARE   Laboratory interpretation all normal except mild anemia, mild leukocytosis, mild hyponatremia, and renal insufficiency which is stable     Imaging Review No results found.   EKG Interpretation None      MDM   Final diagnoses:  Urinary tract infection with hematuria, site unspecified  Nausea vomiting and diarrhea  Dehydration    New Prescriptions   CEPHALEXIN (KEFLEX) 500 MG CAPSULE    Take 1 capsule (500 mg total) by mouth 3 (three) times daily.   ONDANSETRON (ZOFRAN) 4 MG TABLET    Take 1 tablet (4 mg total) by mouth every 8 (eight) hours as needed for nausea or vomiting.    Plan discharge  Devoria Albe, MD, FACEP   I personally performed the services described in this documentation, which was scribed in my presence. The recorded information has been reviewed and considered.  Devoria Albe, MD, FACEP      Ward Givens, MD 06/13/14 [phone]

## 2014-06-13 NOTE — Discharge Instructions (Signed)
Give him plenty of fluids to drink. Avoid milk until the diarrhea is gone. Use the Zofran for nausea or vomiting. You can give him Imodium over-the-counter as needed for diarrhea. Give him the antibiotics until gone for his urinary tract infection. Have him rechecked if he gets a high fever or he has uncontrolled vomiting and diarrhea or if you are concerned he has become dehydrated again.    Urinary Tract Infection A urinary tract infection (UTI) can occur any place along the urinary tract. The tract includes the kidneys, ureters, bladder, and urethra. A type of germ called bacteria often causes a UTI. UTIs are often helped with antibiotic medicine.  HOME CARE   If given, take antibiotics as told by your doctor. Finish them even if you start to feel better.  Drink enough fluids to keep your pee (urine) clear or pale yellow.  Avoid tea, drinks with caffeine, and bubbly (carbonated) drinks.  Pee often. Avoid holding your pee in for a long time.  Pee before and after having sex (intercourse).  Wipe from front to back after you poop (bowel movement) if you are a woman. Use each tissue only once. GET HELP RIGHT AWAY IF:   You have back pain.  You have lower belly (abdominal) pain.  You have chills.  You feel sick to your stomach (nauseous).  You throw up (vomit).  Your burning or discomfort with peeing does not go away.  You have a fever.  Your symptoms are not better in 3 days. MAKE SURE YOU:   Understand these instructions.  Will watch your condition.  Will get help right away if you are not doing well or get worse. Document Released: 11/06/2007 Document Revised: 02/12/2012 Document Reviewed: 12/19/2011 [facility] Patient Information 2015 [place], [place]. This information is not intended to replace advice given to you by your health care provider. Make sure you discuss any questions you have with your health care provider.  Nausea and Vomiting Nausea means you feel sick to  your stomach. Throwing up (vomiting) is a reflex where stomach contents come out of your mouth. HOME CARE   Take medicine as told by your doctor.  Do not force yourself to eat. However, you do need to drink fluids.  If you feel like eating, eat a normal diet as told by your doctor.  Eat rice, wheat, potatoes, bread, lean meats, yogurt, fruits, and vegetables.  Avoid high-fat foods.  Drink enough fluids to keep your pee (urine) clear or pale yellow.  Ask your doctor how to replace body fluid losses (rehydrate). Signs of body fluid loss (dehydration) include:  Feeling very thirsty.  Dry lips and mouth.  Feeling dizzy.  Dark pee.  Peeing less than normal.  Feeling confused.  Fast breathing or heart rate. GET HELP RIGHT AWAY IF:   You have blood in your throw up.  You have black or bloody poop (stool).  You have a bad headache or stiff neck.  You feel confused.  You have bad belly (abdominal) pain.  You have chest pain or trouble breathing.  You do not pee at least once every 8 hours.  You have cold, clammy skin.  You keep throwing up after 24 to 48 hours.  You have a fever. MAKE SURE YOU:   Understand these instructions.  Will watch your condition.  Will get help right away if you are not doing well or get worse. Document Released: 11/06/2007 Document Revised: 08/12/2011 Document Reviewed: 10/19/2010 [facility] Patient Information 2015 [place], [place].  This information is not intended to replace advice given to you by your health care provider. Make sure you discuss any questions you have with your health care provider.  Diarrhea Diarrhea is watery poop (stool). It can make you feel weak, tired, thirsty, or give you a dry mouth (signs of dehydration). Watery poop is a sign of another problem, most often an infection. It often lasts 2-3 days. It can last longer if it is a sign of something serious. Take care of yourself as told by your doctor. HOME CARE    Drink 1 cup (8 ounces) of fluid each time you have watery poop.  Do not drink the following fluids:  Those that contain simple sugars (fructose, glucose, galactose, lactose, sucrose, maltose).  Sports drinks.  Fruit juices.  Whole milk products.  Sodas.  Drinks with caffeine (coffee, tea, soda) or alcohol.  Oral rehydration solution may be used if the doctor says it is okay. You may make your own solution. Follow this recipe:   - teaspoon table salt.   teaspoon baking soda.   teaspoon salt substitute containing potassium chloride.  1 tablespoons sugar.  1 liter (34 ounces) of water.  Avoid the following foods:  High fiber foods, such as raw fruits and vegetables.  Nuts, seeds, and whole grain breads and cereals.   Those that are sweetened with sugar alcohols (xylitol, sorbitol, mannitol).  Try eating the following foods:  Starchy foods, such as rice, toast, pasta, low-sugar cereal, oatmeal, baked potatoes, crackers, and bagels.  Bananas.  Applesauce.  Eat probiotic-rich foods, such as yogurt and milk products that are fermented.  Wash your hands well after each time you have watery poop.  Only take medicine as told by your doctor.  Take a warm bath to help lessen burning or pain from having watery poop. GET HELP RIGHT AWAY IF:   You cannot drink fluids without throwing up (vomiting).  You keep throwing up.  You have blood in your poop, or your poop looks black and tarry.  You do not pee (urinate) in 6-8 hours, or there is only a small amount of very dark pee.  You have belly (abdominal) pain that gets worse or stays in the same spot (localizes).  You are weak, dizzy, confused, or light-headed.  You have a very bad headache.  Your watery poop gets worse or does not get better.  You have a fever or lasting symptoms for more than 2-3 days.  You have a fever and your symptoms suddenly get worse. MAKE SURE YOU:   Understand these  instructions.  Will watch your condition.  Will get help right away if you are not doing well or get worse. Document Released: 11/06/2007 Document Revised: 10/04/2013 Document Reviewed: 01/26/2012 [facility] Patient Information 2015 [place]. This information is not intended to replace advice given to you by your health care provider. Make sure you discuss any questions you have with your health care provider.

## 2014-06-13 NOTE — ED Notes (Signed)
Pt denies pain, states has been having vomiting and diarrhea x 2 days, no appetite.

## 2014-06-13 NOTE — ED Notes (Signed)
Discharge instructions given, pt and wife demonstrated teach back and verbal understanding. No concerns voiced.  

## 2014-06-15 LAB — URINE CULTURE

## 2014-06-16 ENCOUNTER — Telehealth (HOSPITAL_BASED_OUTPATIENT_CLINIC_OR_DEPARTMENT_OTHER): Payer: Self-pay | Admitting: Emergency Medicine

## 2014-06-16 NOTE — Telephone Encounter (Signed)
Post ED Visit - Positive Culture Follow-up  Culture report reviewed by antimicrobial stewardship pharmacist: []  Wes Dulaney, Pharm.D., BCPS []  Celedonio MiyamotoJeremy Frens, [address] BCPS []  Georgina PillionElizabeth Martin, Pharm.D., BCPS []  IowaMinh Pham, [address]Ph[address] BCPS, AAHIVP []  Estella HuskMichelle Turner, Pharm.D., BCPS, AAHIVP [x]  Donald CyphersLorie Mcconnell, [address]Ph[address] BCPS  Positive urine  Culture E. Coli Treated with cephalexin,  organism sensitive to the same and no further patient follow-up is required at this time.  Donald MullMiller, Donald Mcconnell 06/16/2014, 3:09 PM

## 2014-07-25 ENCOUNTER — Emergency Department (HOSPITAL_COMMUNITY): Payer: Medicare HMO

## 2014-07-25 ENCOUNTER — Other Ambulatory Visit (HOSPITAL_COMMUNITY): Payer: Self-pay

## 2014-07-25 ENCOUNTER — Inpatient Hospital Stay (HOSPITAL_COMMUNITY)
Admission: EM | Admit: 2014-07-25 | Discharge: 2014-07-27 | DRG: 682 | Disposition: A | Discharge status: Home / Self Care | Payer: Medicare HMO | Attending: Internal Medicine | Admitting: Internal Medicine

## 2014-07-25 ENCOUNTER — Encounter (HOSPITAL_COMMUNITY): Payer: Self-pay | Admitting: Emergency Medicine

## 2014-07-25 DIAGNOSIS — E039 Hypothyroidism, unspecified: Secondary | ICD-10-CM | POA: Diagnosis present

## 2014-07-25 DIAGNOSIS — J069 Acute upper respiratory infection, unspecified: Secondary | ICD-10-CM | POA: Diagnosis present

## 2014-07-25 DIAGNOSIS — R509 Fever, unspecified: Secondary | ICD-10-CM | POA: Diagnosis not present

## 2014-07-25 DIAGNOSIS — E860 Dehydration: Secondary | ICD-10-CM | POA: Diagnosis present

## 2014-07-25 DIAGNOSIS — Z9849 Cataract extraction status, unspecified eye: Secondary | ICD-10-CM

## 2014-07-25 DIAGNOSIS — N179 Acute kidney failure, unspecified: Secondary | ICD-10-CM | POA: Diagnosis not present

## 2014-07-25 DIAGNOSIS — Z8673 Personal history of transient ischemic attack (TIA), and cerebral infarction without residual deficits: Secondary | ICD-10-CM

## 2014-07-25 DIAGNOSIS — Z6820 Body mass index (BMI) 20.0-20.9, adult: Secondary | ICD-10-CM

## 2014-07-25 DIAGNOSIS — F0280 Dementia in other diseases classified elsewhere without behavioral disturbance: Secondary | ICD-10-CM | POA: Diagnosis present

## 2014-07-25 DIAGNOSIS — Z8639 Personal history of other endocrine, nutritional and metabolic disease: Secondary | ICD-10-CM

## 2014-07-25 DIAGNOSIS — I10 Essential (primary) hypertension: Secondary | ICD-10-CM | POA: Diagnosis present

## 2014-07-25 DIAGNOSIS — N183 Chronic kidney disease, stage 3 unspecified: Secondary | ICD-10-CM

## 2014-07-25 DIAGNOSIS — Z79899 Other long term (current) drug therapy: Secondary | ICD-10-CM

## 2014-07-25 DIAGNOSIS — I129 Hypertensive chronic kidney disease with stage 1 through stage 4 chronic kidney disease, or unspecified chronic kidney disease: Secondary | ICD-10-CM | POA: Diagnosis present

## 2014-07-25 DIAGNOSIS — E46 Unspecified protein-calorie malnutrition: Secondary | ICD-10-CM

## 2014-07-25 DIAGNOSIS — N289 Disorder of kidney and ureter, unspecified: Secondary | ICD-10-CM

## 2014-07-25 DIAGNOSIS — G309 Alzheimer's disease, unspecified: Secondary | ICD-10-CM | POA: Diagnosis present

## 2014-07-25 DIAGNOSIS — N390 Urinary tract infection, site not specified: Secondary | ICD-10-CM | POA: Diagnosis present

## 2014-07-25 DIAGNOSIS — I493 Ventricular premature depolarization: Secondary | ICD-10-CM | POA: Diagnosis present

## 2014-07-25 DIAGNOSIS — E43 Unspecified severe protein-calorie malnutrition: Secondary | ICD-10-CM | POA: Diagnosis present

## 2014-07-25 DIAGNOSIS — M109 Gout, unspecified: Secondary | ICD-10-CM | POA: Diagnosis present

## 2014-07-25 DIAGNOSIS — Z7982 Long term (current) use of aspirin: Secondary | ICD-10-CM

## 2014-07-25 DIAGNOSIS — R5381 Other malaise: Secondary | ICD-10-CM

## 2014-07-25 DIAGNOSIS — I639 Cerebral infarction, unspecified: Secondary | ICD-10-CM | POA: Diagnosis present

## 2014-07-25 DIAGNOSIS — Z8739 Personal history of other diseases of the musculoskeletal system and connective tissue: Secondary | ICD-10-CM

## 2014-07-25 HISTORY — DX: Unspecified dementia, unspecified severity, without behavioral disturbance, psychotic disturbance, mood disturbance, and anxiety: F03.90

## 2014-07-25 LAB — COMPREHENSIVE METABOLIC PANEL
ALBUMIN: 3.3 g/dL — AB (ref 3.5–5.2)
ALT: 14 U/L (ref 0–53)
AST: 17 U/L (ref 0–37)
Alkaline Phosphatase: 58 U/L (ref 39–117)
Anion gap: 10 (ref 5–15)
BUN: 42 mg/dL — ABNORMAL HIGH (ref 6–23)
CO2: 26 mmol/L (ref 19–32)
CREATININE: 2.49 mg/dL — AB (ref 0.50–1.35)
Calcium: 8.8 mg/dL (ref 8.4–10.5)
Chloride: 105 mmol/L (ref 96–112)
GFR calc Af Amer: 26 mL/min — ABNORMAL LOW (ref >90)
GFR, EST NON AFRICAN AMERICAN: 22 mL/min — AB (ref >90)
Glucose, Bld: 120 mg/dL — ABNORMAL HIGH (ref 70–99)
POTASSIUM: 3.7 mmol/L (ref 3.5–5.1)
SODIUM: 141 mmol/L (ref 135–145)
Total Bilirubin: 0.7 mg/dL (ref 0.3–1.2)
Total Protein: 6.9 g/dL (ref 6.0–8.3)

## 2014-07-25 LAB — URINALYSIS, ROUTINE W REFLEX MICROSCOPIC
BILIRUBIN URINE: NEGATIVE
Glucose, UA: NEGATIVE mg/dL
Nitrite: POSITIVE — AB
PH: 5.5 (ref 5.0–8.0)
Protein, ur: 30 mg/dL — AB
UROBILINOGEN UA: 0.2 mg/dL (ref 0.0–1.0)

## 2014-07-25 LAB — CBC WITH DIFFERENTIAL/PLATELET
Basophils Absolute: 0 10*3/uL (ref 0.0–0.1)
Basophils Relative: 1 % (ref 0–1)
EOS ABS: 0.1 10*3/uL (ref 0.0–0.7)
Eosinophils Relative: 1 % (ref 0–5)
HEMATOCRIT: 37.5 % — AB (ref 39.0–52.0)
Hemoglobin: 12.7 g/dL — ABNORMAL LOW (ref 13.0–17.0)
Lymphocytes Relative: 34 % (ref 12–46)
Lymphs Abs: 2.7 10*3/uL (ref 0.7–4.0)
MCH: 31.7 pg (ref 26.0–34.0)
MCHC: 33.9 g/dL (ref 30.0–36.0)
MCV: 93.5 fL (ref 78.0–100.0)
MONOS PCT: 12 % (ref 3–12)
Monocytes Absolute: 1 10*3/uL (ref 0.1–1.0)
NEUTROS PCT: 52 % (ref 43–77)
Neutro Abs: 4.2 10*3/uL (ref 1.7–7.7)
PLATELETS: 221 10*3/uL (ref 150–400)
RBC: 4.01 MIL/uL — AB (ref 4.22–5.81)
RDW: 14.8 % (ref 11.5–15.5)
WBC: 8 10*3/uL (ref 4.0–10.5)

## 2014-07-25 LAB — URINE MICROSCOPIC-ADD ON

## 2014-07-25 LAB — TROPONIN I: Troponin I: <0.03 ng/mL (ref <0.031)

## 2014-07-25 MED ORDER — ASPIRIN EC 81 MG PO TBEC
81.0000 mg | DELAYED_RELEASE_TABLET | Freq: Every day | ORAL | Status: DC
  Administered 2014-07-26 – 2014-07-27 (×2): 81 mg via ORAL
  Filled 2014-07-25 (×2): qty 1

## 2014-07-25 MED ORDER — ATENOLOL 25 MG PO TABS
50.0000 mg | ORAL_TABLET | Freq: Every day | ORAL | Status: DC
Start: 2014-07-25 — End: 2014-07-25

## 2014-07-25 MED ORDER — SODIUM CHLORIDE 0.9 % IV SOLN
INTRAVENOUS | Status: AC
  Administered 2014-07-25: 22:00:00 via INTRAVENOUS

## 2014-07-25 MED ORDER — ATENOLOL 25 MG PO TABS
50.0000 mg | ORAL_TABLET | Freq: Every day | ORAL | Status: DC
  Administered 2014-07-26 – 2014-07-27 (×2): 50 mg via ORAL
  Filled 2014-07-25 (×2): qty 2

## 2014-07-25 MED ORDER — ALLOPURINOL 300 MG PO TABS
150.0000 mg | ORAL_TABLET | Freq: Every day | ORAL | Status: DC
  Administered 2014-07-26 – 2014-07-27 (×2): 150 mg via ORAL
  Filled 2014-07-25 (×2): qty 1

## 2014-07-25 MED ORDER — LEVOTHYROXINE SODIUM 100 MCG PO TABS
100.0000 ug | ORAL_TABLET | Freq: Every day | ORAL | Status: DC
  Administered 2014-07-26 – 2014-07-27 (×2): 100 ug via ORAL
  Filled 2014-07-25 (×2): qty 1

## 2014-07-25 MED ORDER — ONDANSETRON HCL 4 MG PO TABS: 4.0000 mg | ORAL_TABLET | Freq: Four times a day (QID) | ORAL | Status: DC | PRN

## 2014-07-25 MED ORDER — ONDANSETRON HCL 4 MG/2ML IJ SOLN: 4.0000 mg | Freq: Four times a day (QID) | INTRAMUSCULAR | Status: DC | PRN

## 2014-07-25 MED ORDER — SODIUM CHLORIDE 0.9 % IV SOLN
INTRAVENOUS | Status: DC
  Administered 2014-07-25 – 2014-07-26 (×2): via INTRAVENOUS

## 2014-07-25 MED ORDER — AMLODIPINE BESYLATE 5 MG PO TABS
5.0000 mg | ORAL_TABLET | Freq: Every day | ORAL | Status: DC
  Administered 2014-07-26 – 2014-07-27 (×2): 5 mg via ORAL
  Filled 2014-07-25 (×2): qty 1

## 2014-07-25 MED ORDER — SODIUM CHLORIDE 0.9 % IV BOLUS (SEPSIS)
1000.0000 mL | Freq: Once | INTRAVENOUS | Status: AC
Start: 2014-07-25 — End: 2014-07-25
  Administered 2014-07-25: 1000 mL via INTRAVENOUS

## 2014-07-25 MED ORDER — ENSURE PUDDING PO PUDG
1.0000 | Freq: Three times a day (TID) | ORAL | Status: DC
  Administered 2014-07-25 – 2014-07-27 (×4): 1 via ORAL

## 2014-07-25 MED ORDER — ASPIRIN EC 81 MG PO TBEC: 81.0000 mg | DELAYED_RELEASE_TABLET | Freq: Every day | ORAL | Status: DC

## 2014-07-25 MED ORDER — HEPARIN SODIUM (PORCINE) 5000 UNIT/ML IJ SOLN
5000.0000 [IU] | Freq: Three times a day (TID) | INTRAMUSCULAR | Status: DC
  Administered 2014-07-25 – 2014-07-27 (×5): 5000 [IU] via SUBCUTANEOUS
  Filled 2014-07-25 (×5): qty 1

## 2014-07-25 MED ORDER — AMLODIPINE BESYLATE 5 MG PO TABS
5.0000 mg | ORAL_TABLET | Freq: Every day | ORAL | Status: DC
Start: 2014-07-25 — End: 2014-07-25

## 2014-07-25 NOTE — ED Provider Notes (Signed)
CSN: [id number]     Arrival date & time 07/25/14  1558 History   First MD Initiated Contact with Patient 07/25/14 1912     Chief Complaint  Patient presents with  . Abnormal ECG     (Consider location/radiation/quality/duration/timing/severity/associated sxs/prior Treatment) The history is provided by the patient, a relative and the spouse.   81 year old male sent in by primary care office for concerns of abnormal EKG and for clinical concerns for dehydration. Patient started with fever cough upper respiratory type symptoms on Thursday. Patient's had some dizziness some lightheadedness no vomiting no diarrhea. Patient's oral intake is been poor. EKG showed PVCs. Patient denies any chest pain. Denies any shortness of breath. Has had cough and fever and congestion.  Past Medical History  Diagnosis Date  . Hypertension   . Thyroid disease     hypothyroidism  . Gout   . Hypothyroidism    Past Surgical History  Procedure Laterality Date  . Colon surgery    . Shoulder surgery    . Eye surgery      cateract surgery  . Tee without cardioversion N/A 02/16/2013    Procedure: TRANSESOPHAGEAL ECHOCARDIOGRAM (TEE);  Surgeon: Wendall Stade, MD;  Location: [facility] ENDOSCOPY;  Service: Cardiovascular;  Laterality: N/A;   Family History  Problem Relation Age of Onset  . Prostate cancer Brother    History  Substance Use Topics  . Smoking status: Never Smoker   . Smokeless tobacco: Not on file  . Alcohol Use: No    Review of Systems  Constitutional: Positive for fever, appetite change and fatigue.  HENT: Positive for congestion.   Eyes: Negative for redness.  Respiratory: Positive for cough. Negative for shortness of breath.   Cardiovascular: Negative for chest pain.  Gastrointestinal: Negative for nausea, vomiting and diarrhea.  Genitourinary: Negative for dysuria.  Musculoskeletal: Negative for myalgias.  Skin: Negative for rash.  Neurological: Positive for light-headedness. Negative for  headaches.  Hematological: Does not bruise/bleed easily.      Allergies  Review of patient's allergies indicates no known allergies.  Home Medications   Prior to Admission medications   Medication Sig Start Date End Date Taking? Authorizing Provider  allopurinol (ZYLOPRIM) 300 MG tablet Take 0.5 tablets (150 mg total) by mouth daily. 02/16/13   Esperanza Sheets, MD  amLODipine (NORVASC) 2.5 MG tablet Take 2 tablets (5 mg total) by mouth daily. 02/16/13   Esperanza Sheets, MD  aspirin EC 81 MG EC tablet Take 1 tablet (81 mg total) by mouth daily. 02/16/13   Esperanza Sheets, MD  atenolol (TENORMIN) 50 MG tablet Take 50 mg by mouth daily.    Historical Provider, MD  cephALEXin (KEFLEX) 500 MG capsule Take 1 capsule (500 mg total) by mouth 3 (three) times daily. 06/13/14   Ward Givens, MD  levothyroxine (SYNTHROID, LEVOTHROID) 100 MCG tablet Take 100 mcg by mouth daily before breakfast.    Historical Provider, MD  ondansetron (ZOFRAN) 4 MG tablet Take 1 tablet (4 mg total) by mouth every 8 (eight) hours as needed for nausea or vomiting. 06/13/14   Ward Givens, MD   BP 124/57 mmHg  Pulse 63  Temp(Src) 97.4 F (36.3 C) (Oral)  Resp 20  Ht  (1.6 m)  Wt 150 lb (68.04 kg)  BMI 26.58 kg/m2  SpO2 98% Physical Exam  Constitutional: He appears well-developed and well-nourished. No distress.  HENT:  Head: Normocephalic and atraumatic.  Mucous membranes dry.  Eyes: Conjunctivae and EOM  are normal. Pupils are equal, round, and reactive to light.  Neck: Normal range of motion.  Cardiovascular: Normal rate, regular rhythm and normal heart sounds.   Pulmonary/Chest: Breath sounds normal. No respiratory distress.  Abdominal: Soft. Bowel sounds are normal. There is no tenderness.  Musculoskeletal: Normal range of motion. He exhibits no edema.  Neurological: He is alert. No cranial nerve deficit. He exhibits normal muscle tone. Coordination normal.  Skin: Skin is warm. No rash noted.  Nursing  note and vitals reviewed.   ED Course  Procedures (including critical care time) Labs Review Labs Reviewed  CBC WITH DIFFERENTIAL/PLATELET - Abnormal; Notable for the following:    RBC 4.01 (*)    Hemoglobin 12.7 (*)    HCT 37.5 (*)    All other components within normal limits  COMPREHENSIVE METABOLIC PANEL - Abnormal; Notable for the following:    Glucose, Bld 120 (*)    BUN 42 (*)    Creatinine, Ser 2.49 (*)    Albumin 3.3 (*)    GFR calc non Af Amer 22 (*)    GFR calc Af Amer 26 (*)    All other components within normal limits  TROPONIN I   Results for orders placed or performed during the hospital encounter of 07/25/14  CBC with Differential  Result Value Ref Range   WBC 8.0 4.0 - 10.5 K/uL   RBC 4.01 (L) 4.22 - 5.81 MIL/uL   Hemoglobin 12.7 (L) 13.0 - 17.0 g/dL   HCT 78.437.5 (L) 69.639.0 - 29.552.0 %   MCV 93.5 78.0 - 100.0 fL   MCH 31.7 26.0 - 34.0 pg   MCHC 33.9 30.0 - 36.0 g/dL   RDW 28.414.8 13.211.5 - 44.015.5 %   Platelets 221 150 - 400 K/uL   Neutrophils Relative % 52 43 - 77 %   Neutro Abs 4.2 1.7 - 7.7 K/uL   Lymphocytes Relative 34 12 - 46 %   Lymphs Abs 2.7 0.7 - 4.0 K/uL   Monocytes Relative 12 3 - 12 %   Monocytes Absolute 1.0 0.1 - 1.0 K/uL   Eosinophils Relative 1 0 - 5 %   Eosinophils Absolute 0.1 0.0 - 0.7 K/uL   Basophils Relative 1 0 - 1 %   Basophils Absolute 0.0 0.0 - 0.1 K/uL  Comprehensive metabolic panel  Result Value Ref Range   Sodium 141 135 - 145 mmol/L   Potassium 3.7 3.5 - 5.1 mmol/L   Chloride 105 96 - 112 mmol/L   CO2 26 19 - 32 mmol/L   Glucose, Bld 120 (H) 70 - 99 mg/dL   BUN 42 (H) 6 - 23 mg/dL   Creatinine, Ser 1.022.49 (H) 0.50 - 1.35 mg/dL   Calcium 8.8 8.4 - 72.510.5 mg/dL   Total Protein 6.9 6.0 - 8.3 g/dL   Albumin 3.3 (L) 3.5 - 5.2 g/dL   AST 17 0 - 37 U/L   ALT 14 0 - 53 U/L   Alkaline Phosphatase 58 39 - 117 U/L   Total Bilirubin 0.7 0.3 - 1.2 mg/dL   GFR calc non Af Amer 22 (L) >90 mL/min   GFR calc Af Amer 26 (L) >90 mL/min   Anion gap  10 5 - 15  Troponin I  Result Value Ref Range   Troponin I <0.03 <0.031 ng/mL     Imaging Review Dg Chest 2 View  07/25/2014   CLINICAL DATA:  81 year old male with abnormal EKG. Cough fever and malaise. Initial encounter.  EXAM: CHEST  2 VIEW  COMPARISON:  02/13/2013 and earlier.  FINDINGS: Larger lung volumes today. Cardiac size within normal limits. Stable mild tortuosity of the thoracic aorta. Other mediastinal contours are within normal limits. Visualized tracheal air column is within normal limits. No pneumothorax or pulmonary edema. No pleural effusion or consolidation. Calcified atherosclerosis of the aorta. No confluent pulmonary opacity. No acute osseous abnormality identified.  IMPRESSION: No acute cardiopulmonary abnormality.   Electronically Signed   By: Odessa Fleming M.D.   On: 07/25/2014 16:43     EKG Interpretation   Date/Time:  Monday July 25 2014 19:20:20 EST Ventricular Rate:  62 PR Interval:  191 QRS Duration: 106 QT Interval:  459 QTC Calculation: 466 R Axis:   18 Text Interpretation:  Sinus rhythm Multiple ventricular premature  complexes Probable anteroseptal infarct, old No significant change since  last tracing Confirmed by Ova Meegan  MD, Rashi Granier [phone]) on 07/25/2014  8:08:41 PM      MDM   Final diagnoses:  Dehydration  Frequent PVCs    Chest x-rays negative for pneumonia pulmonary edema pneumothorax. Patient referred in from a primary care doctor's office for EKG abnormality cough and fever and decreased appetite. Labs here consistent with dehydration.  Patient has an elevated BUN and creatinine but they BUN is elevated more than the creatinine. Suggestive of dehydration prerenal situation.  Patient will need admission and hydration. Patient will have an observation MedSurg admission. EKG and cardiac monitor shows frequent PVCs. Admitting team aware of this. Patient's asymptomatic from them. No runs of V. tach. Troponin was negative.  Patient's symptoms  most consistent with upper respiratory infection may be flulike but patient with no significant hypoxia.      Vanetta Mulders, MD 07/25/14 [phone]

## 2014-07-25 NOTE — ED Notes (Signed)
Onset Thursday pt not feeling well, decreased appetite , Cough, fever, abnormal EKG in office, pt sent to ED

## 2014-07-25 NOTE — H&P (Addendum)
[facility] History and Physical  Donald Mcconnell ZOX:[id number] DOB: [birth year] DOA: 07/25/2014  Referring physician: Dr Deretha Emory - [facility] PCP: Lorenda Peck, MD   Chief Complaint: PVC and URI  HPI: Donald Mcconnell is a 81 y.o. male  Patient reports symptoms of fever, cough, or upper respiratory congestion which started 5 days ago. Associated with intermittent dizziness, lightheadedness. Denies chest pain, shortness of breath, palpitations, nausea, vomiting, diarrhea. Very little oral intake during this period of time. Patient went to PCPs office and was noted to have PVCs on EKG as well as other metabolic abnormalities and was instructed to come to the emergency room. Has not been given anything by family for symptoms. Worsening anorexia during this time.    Review of Systems:  Pt demented and unable to reliably participate.    Past Medical History  Diagnosis Date  . Hypertension   . Thyroid disease     hypothyroidism  . Gout   . Hypothyroidism   . Dementia    Past Surgical History  Procedure Laterality Date  . Colon surgery    . Shoulder surgery    . Eye surgery      cateract surgery  . Tee without cardioversion N/A 02/16/2013    Procedure: TRANSESOPHAGEAL ECHOCARDIOGRAM (TEE);  Surgeon: Wendall Stade, MD;  Location: [facility] ENDOSCOPY;  Service: Cardiovascular;  Laterality: N/A;   Social History:  reports that he has never smoked. He does not have any smokeless tobacco history on file. He reports that he does not drink alcohol or use illicit drugs.  No Known Allergies  Family History  Problem Relation Age of Onset  . Prostate cancer Brother      Prior to Admission medications   Medication Sig Start Date End Date Taking? Authorizing Provider  allopurinol (ZYLOPRIM) 300 MG tablet Take 0.5 tablets (150 mg total) by mouth daily. 02/16/13   Esperanza Sheets, MD  amLODipine (NORVASC) 2.5 MG tablet Take 2 tablets (5 mg total) by mouth daily. 02/16/13   Esperanza Sheets,  MD  aspirin EC 81 MG EC tablet Take 1 tablet (81 mg total) by mouth daily. 02/16/13   Esperanza Sheets, MD  atenolol (TENORMIN) 50 MG tablet Take 50 mg by mouth daily.    Historical Provider, MD  cephALEXin (KEFLEX) 500 MG capsule Take 1 capsule (500 mg total) by mouth 3 (three) times daily. 06/13/14   Ward Givens, MD  levothyroxine (SYNTHROID, LEVOTHROID) 100 MCG tablet Take 100 mcg by mouth daily before breakfast.    Historical Provider, MD  ondansetron (ZOFRAN) 4 MG tablet Take 1 tablet (4 mg total) by mouth every 8 (eight) hours as needed for nausea or vomiting. 06/13/14   Ward Givens, MD   Physical Exam: Filed Vitals:   07/25/14 1922 07/25/14 1925 07/25/14 1956 07/25/14 2030  BP:   124/57 117/63  Pulse: 63  63   Temp:  97.4 F (36.3 C)    TempSrc:  Oral    Resp: Height:      Weight:      SpO2: 100%  98% 98%    Wt Readings from Last 3 Encounters:  07/25/14 68.04 kg (150 lb)  06/13/14 79.379 kg (175 lb)  02/13/13 82.283 kg (181 lb 6.4 oz)    General:  Appears calm and comfortable Eyes:  PERRL, normal lids, irises & conjunctiva ENT: Dry mucus membranes  Neck:  no LAD, masses or thyromegaly Cardiovascular:  RRR, no m/r/g. Trace  LE edema. Telemetry:  SR, no arrhythmias  Respiratory:  CTA bilaterally, no w/r/r. Normal respiratory effort. Abdomen:  soft, ntnd Skin:  no rash or induration seen on limited exam Musculoskeletal:  grossly normal tone BUE/BLE Psychiatric:  Demented and does not answer questions appropriately Neurologic:  grossly non-focal.          Labs on Admission:  Basic Metabolic Panel:  Recent Labs Lab 07/25/14 1610  NA 141  K 3.7  CL 105  CO2 26  GLUCOSE 120*  BUN 42*  CREATININE 2.49*  CALCIUM 8.8   Liver Function Tests:  Recent Labs Lab 07/25/14 1610  AST 17  ALT 14  ALKPHOS 58  BILITOT 0.7  PROT 6.9  ALBUMIN 3.3*   No results for input(s): LIPASE, AMYLASE in the last 168 hours. No results for input(s): AMMONIA in the last  168 hours. CBC:  Recent Labs Lab 07/25/14 1610  WBC 8.0  NEUTROABS 4.2  HGB 12.7*  HCT 37.5*  MCV 93.5  PLT 221   Cardiac Enzymes:  Recent Labs Lab 07/25/14 1610  TROPONINI <0.03    BNP (last 3 results) No results for input(s): BNP in the last 8760 hours.  ProBNP (last 3 results) No results for input(s): PROBNP in the last 8760 hours.  CBG: No results for input(s): GLUCAP in the last 168 hours.  Radiological Exams on Admission: Dg Chest 2 View  07/25/2014   CLINICAL DATA:  81 year old male with abnormal EKG. Cough fever and malaise. Initial encounter.  EXAM: CHEST  2 VIEW  COMPARISON:  02/13/2013 and earlier.  FINDINGS: Larger lung volumes today. Cardiac size within normal limits. Stable mild tortuosity of the thoracic aorta. Other mediastinal contours are within normal limits. Visualized tracheal air column is within normal limits. No pneumothorax or pulmonary edema. No pleural effusion or consolidation. Calcified atherosclerosis of the aorta. No confluent pulmonary opacity. No acute osseous abnormality identified.  IMPRESSION: No acute cardiopulmonary abnormality.   Electronically Signed   By: Odessa FlemingH  Hall M.D.   On: 07/25/2014 16:43    EKG: Independently reviewed. PVC, no ACS, sinus  Assessment/Plan Principal Problem:   Acute worsening of stage 3 chronic kidney disease Active Problems:   CVA (cerebral vascular accident)   HTN (hypertension)   Hypothyroidism   History of gout   Alzheimer's dementia   Dehydration   Physical deconditioning   Malnutrition   Viral URI  Acute on chronic kidney disease III: Cr 2.49. Baseline, 1.5. Likely due to dehydration.  - IVF NS 1L bolus (no CHF on last Echo) - NS 12900ml/hr - UA - UCX - BMEt in am  URI: AFVSS. Pt likely at nadir. No sign of overt distress, sinusitis.  - respiratory viral panel  Dementia: at baseline per family. Completely demented. Very difficult to give reliable history.  - Monitor  HTN: normotensive -  continue norvasc, atenolol  Hypothyroid:  - continue synthroid  Gout: no recent flares - cont Allopurinol  Malnutrition: likely secondary to viral URI and dementia causing anorexia. - nutrition consult  Physical deconditioning - PT/OT   Code Status: FULL (must indicate code status--if unknown or must be presumed, indicate so)  DVT Prophylaxis: hep Family Communication: wife and son  Disposition Plan: Pending improvement  MERRELL, DAVID Shela CommonsJ, MD Family Medicine [facility] [email or web address] Password [email or web address]

## 2014-07-25 NOTE — ED Notes (Signed)
MD at bedside. 

## 2014-07-25 NOTE — ED Notes (Signed)
Hospitalist at bedside 

## 2014-07-26 DIAGNOSIS — I493 Ventricular premature depolarization: Secondary | ICD-10-CM | POA: Diagnosis present

## 2014-07-26 DIAGNOSIS — Z9849 Cataract extraction status, unspecified eye: Secondary | ICD-10-CM | POA: Diagnosis not present

## 2014-07-26 DIAGNOSIS — N390 Urinary tract infection, site not specified: Secondary | ICD-10-CM

## 2014-07-26 DIAGNOSIS — N179 Acute kidney failure, unspecified: Secondary | ICD-10-CM | POA: Diagnosis present

## 2014-07-26 DIAGNOSIS — Z79899 Other long term (current) drug therapy: Secondary | ICD-10-CM | POA: Diagnosis not present

## 2014-07-26 DIAGNOSIS — N183 Chronic kidney disease, stage 3 (moderate): Secondary | ICD-10-CM | POA: Diagnosis present

## 2014-07-26 DIAGNOSIS — R509 Fever, unspecified: Secondary | ICD-10-CM | POA: Diagnosis present

## 2014-07-26 DIAGNOSIS — Z6820 Body mass index (BMI) 20.0-20.9, adult: Secondary | ICD-10-CM | POA: Diagnosis not present

## 2014-07-26 DIAGNOSIS — E860 Dehydration: Secondary | ICD-10-CM | POA: Diagnosis present

## 2014-07-26 DIAGNOSIS — Z8673 Personal history of transient ischemic attack (TIA), and cerebral infarction without residual deficits: Secondary | ICD-10-CM | POA: Diagnosis not present

## 2014-07-26 DIAGNOSIS — M109 Gout, unspecified: Secondary | ICD-10-CM | POA: Diagnosis present

## 2014-07-26 DIAGNOSIS — G309 Alzheimer's disease, unspecified: Secondary | ICD-10-CM | POA: Diagnosis present

## 2014-07-26 DIAGNOSIS — I129 Hypertensive chronic kidney disease with stage 1 through stage 4 chronic kidney disease, or unspecified chronic kidney disease: Secondary | ICD-10-CM | POA: Diagnosis present

## 2014-07-26 DIAGNOSIS — Z7982 Long term (current) use of aspirin: Secondary | ICD-10-CM | POA: Diagnosis not present

## 2014-07-26 DIAGNOSIS — J069 Acute upper respiratory infection, unspecified: Secondary | ICD-10-CM | POA: Diagnosis present

## 2014-07-26 DIAGNOSIS — F0280 Dementia in other diseases classified elsewhere without behavioral disturbance: Secondary | ICD-10-CM | POA: Diagnosis present

## 2014-07-26 DIAGNOSIS — E43 Unspecified severe protein-calorie malnutrition: Secondary | ICD-10-CM | POA: Insufficient documentation

## 2014-07-26 DIAGNOSIS — E039 Hypothyroidism, unspecified: Secondary | ICD-10-CM | POA: Diagnosis present

## 2014-07-26 LAB — COMPREHENSIVE METABOLIC PANEL
ALBUMIN: 2.7 g/dL — AB (ref 3.5–5.2)
ALK PHOS: 49 U/L (ref 39–117)
ALT: 12 U/L (ref 0–53)
AST: 14 U/L (ref 0–37)
Anion gap: 10 (ref 5–15)
BILIRUBIN TOTAL: 0.6 mg/dL (ref 0.3–1.2)
BUN: 37 mg/dL — ABNORMAL HIGH (ref 6–23)
CHLORIDE: 109 mmol/L (ref 96–112)
CO2: 24 mmol/L (ref 19–32)
Calcium: 8.2 mg/dL — ABNORMAL LOW (ref 8.4–10.5)
Creatinine, Ser: 1.86 mg/dL — ABNORMAL HIGH (ref 0.50–1.35)
GFR calc Af Amer: 37 mL/min — ABNORMAL LOW (ref >90)
GFR calc non Af Amer: 32 mL/min — ABNORMAL LOW (ref >90)
Glucose, Bld: 101 mg/dL — ABNORMAL HIGH (ref 70–99)
Potassium: 3.4 mmol/L — ABNORMAL LOW (ref 3.5–5.1)
SODIUM: 143 mmol/L (ref 135–145)
Total Protein: 5.6 g/dL — ABNORMAL LOW (ref 6.0–8.3)

## 2014-07-26 LAB — CBC
HCT: 32.4 % — ABNORMAL LOW (ref 39.0–52.0)
Hemoglobin: 10.7 g/dL — ABNORMAL LOW (ref 13.0–17.0)
MCH: 30.5 pg (ref 26.0–34.0)
MCHC: 33 g/dL (ref 30.0–36.0)
MCV: 92.3 fL (ref 78.0–100.0)
PLATELETS: 181 10*3/uL (ref 150–400)
RBC: 3.51 MIL/uL — ABNORMAL LOW (ref 4.22–5.81)
RDW: 14.8 % (ref 11.5–15.5)
WBC: 6.6 10*3/uL (ref 4.0–10.5)

## 2014-07-26 MED ORDER — SODIUM CHLORIDE 0.9 % IV SOLN
INTRAVENOUS | Status: DC
  Administered 2014-07-26 – 2014-07-27 (×2): via INTRAVENOUS

## 2014-07-26 MED ORDER — LORAZEPAM 2 MG/ML IJ SOLN
1.0000 mg | Freq: Once | INTRAMUSCULAR | Status: AC
  Administered 2014-07-26: 1 mg via INTRAVENOUS
  Filled 2014-07-26: qty 1

## 2014-07-26 MED ORDER — POTASSIUM CHLORIDE CRYS ER 20 MEQ PO TBCR
40.0000 meq | EXTENDED_RELEASE_TABLET | Freq: Once | ORAL | Status: AC
  Administered 2014-07-26: 40 meq via ORAL
  Filled 2014-07-26: qty 2

## 2014-07-26 MED ORDER — POTASSIUM CHLORIDE CRYS ER 10 MEQ PO TBCR
30.0000 meq | EXTENDED_RELEASE_TABLET | Freq: Once | ORAL | Status: AC
  Administered 2014-07-26: 30 meq via ORAL
  Filled 2014-07-26: qty 3

## 2014-07-26 MED ORDER — LORAZEPAM 2 MG/ML IJ SOLN
1.0000 mg | INTRAMUSCULAR | Status: DC | PRN
Start: 2014-07-26 — End: 2014-07-27

## 2014-07-26 MED ORDER — ENSURE COMPLETE PO LIQD: 237.0000 mL | Freq: Two times a day (BID) | ORAL | Status: DC

## 2014-07-26 MED ORDER — CEFTRIAXONE SODIUM IN DEXTROSE 20 MG/ML IV SOLN
1.0000 g | INTRAVENOUS | Status: DC
Start: 2014-07-26 — End: 2014-07-27
  Administered 2014-07-26: 1 g via INTRAVENOUS
  Filled 2014-07-26: qty 50
  Filled 2014-07-26: qty 20

## 2014-07-26 NOTE — Progress Notes (Signed)
Patient is agitated and does not understand he is in the hospital.  Wife is wanting to take patient home and ask that I notified the MD.  MD has been notified.  Will continue to monitor patient.

## 2014-07-26 NOTE — Progress Notes (Signed)
Flu PCR negative.  Droplet precautions discontinued.

## 2014-07-26 NOTE — Progress Notes (Addendum)
Patient ID: Donald Mcconnell, male   DOB: 02-19-1929, 81 y.o.   MRN: [id number] [facility] PROGRESS NOTE  Donald HeinzBobby F Mcconnell [name]:[id number] DOB: 02-19-1929 DOA: 07/25/2014 PCP: Lorenda PeckOBERTS, RONALD WAYNE, MD  Brief narrative:    81 y.o. male with history of CVA, hypertension, alzheimer's dementia, hypothyroidism who presented to [facility] ED with complaints of fevers, cough, upper respiratory tract congestion for past 5 days prior to this admission. Also reported poor po intake. No chest pain, shortness of breath, chest pain, palpitations. No abdominal pain, nausea or vomiting. Patient went to PCPs office and was noted to have PVCs on EKG as well as other metabolic abnormalities and was instructed to come to ED for further evaluation. On admission, pt was hemodynamically stable. Blood work showed hemoglobin of 12.7, creatinine 2.49, normal troponin level. The 12 lead EKG showed sinus rhythm. Of note, UA showed moderate leukocytes, positive nitrites and many bacteria. We started rocephin.    Assessment/Plan:    Principal Problem:   Acute worsening of stage 3 chronic kidney disease - baseline creatinine 1.6 in 06/2014 and on this admission 2.49 - likely prerenal due to dehydration - continue IV fluids - creatinine improving since admission  Active Problems: URI - respiratory status stable - resp viral panel pending   UTI - UA showed moderate leukocytes, positive nitrites and many bacteria. - Will start rocephin - Follow urine culture results   PVC - not seen on admission EKG - no complaints of chest pain - troponin WNL  Alzheimer's dementia - intermittent agitation - PT evaluation pending  HTN (hypertension) - continue Norvasc 5 mg daily  Hypothyroidism - continue synthroid  History of gout - continue allopurinol  Dehydration / Physical deconditioning - continue IV fluids - PT evaluation pending - nutrition consulted     Protein calorie malnutrition, severe - nutrition consulted   - continue nutritional supplementation     DVT Prophylaxis  - Heparin subQ ordered    Code Status: Full.  Family Communication:  plan of care discussed with the patient's wife at the bedside  Disposition Plan: Needs PT evaluation for safe D/C plan.  IV access:  Peripheral IV  Procedures and diagnostic studies:    Dg Chest 2 View 07/25/2014  No acute cardiopulmonary abnormality.  Medical Consultants:  None  Other Consultants:  Physical therapy Nutrition   IAnti-Infectives:   Rocephin 07/26/2014 -->   Manson PasseyEVINE, Karolyna Bianchini, MD  [facility] Pager [phone]  If 7PM-7AM, please contact night-coverage [email or web address] Password [email or web address] 07/26/2014, 2:41 PM      HPI/Subjective: No acute overnight events. This am pt alert, eating breakfast but then became agitated during lunch time, confused and disoriented.   Objective: Filed Vitals:   07/25/14 2214 07/26/14 0608 07/26/14 1035 07/26/14 1438  BP: 154/48 145/61 119/58 136/52  Pulse: 72 70 80 74  Temp: 97.9 F (36.6 C) 97.5 F (36.4 C)  98 F (36.7 C)  TempSrc: Oral Oral    Resp: 16   18  Height: 5\' 10"  (1.778 m)     Weight: 65.998 kg (145 lb 8 oz)     SpO2: 98% 98%  97%    Intake/Output Summary (Last 24 hours) at 07/26/14 1441 Last data filed at 07/26/14 0609  Gross per 24 hour  Intake      0 ml  Output      2 ml  Net     -2 ml    Exam:   General:  Pt is alert, not in acute distress  Cardiovascular: Regular rate and rhythm, S1/S2, no murmurs  Respiratory: congested with mild wheezing, no rhonchi  Abdomen: Soft, non tender, non distended, bowel sounds present  Extremities: pulses DP and PT palpable bilaterally  Neuro: Grossly nonfocal  Data Reviewed: Basic Metabolic Panel:  Recent Labs Lab 07/25/14 1610 07/26/14 0642  NA 141 143  K 3.7 3.4*  CL 105 109  CO2 26 24  GLUCOSE 120* 101*  BUN 42* 37*  CREATININE 2.49* 1.86*  CALCIUM 8.8 8.2*   Liver Function Tests:  Recent Labs Lab  07/25/14 1610 07/26/14 0642  AST 17 14  ALT 14 12  ALKPHOS 58 49  BILITOT 0.7 0.6  PROT 6.9 5.6*  ALBUMIN 3.3* 2.7*   No results for input(s): LIPASE, AMYLASE in the last 168 hours. No results for input(s): AMMONIA in the last 168 hours. CBC:  Recent Labs Lab 07/25/14 1610 07/26/14 0642  WBC 8.0 6.6  NEUTROABS 4.2  --   HGB 12.7* 10.7*  HCT 37.5* 32.4*  MCV 93.5 92.3  PLT 221 181   Cardiac Enzymes:  Recent Labs Lab 07/25/14 1610  TROPONINI <0.03   BNP: Invalid input(s): POCBNP CBG: No results for input(s): GLUCAP in the last 168 hours.  No results found for this or any previous visit (from the past 240 hour(s)).   Scheduled Meds: . allopurinol  150 mg Oral Daily  . amLODipine  5 mg Oral Daily  . aspirin EC  81 mg Oral Daily  . atenolol  50 mg Oral Daily  . feeding supplement (ENSURE)  1 Container Oral TID BM  . heparin  5,000 Units Subcutaneous 3 times per day  . levothyroxine  100 mcg Oral QAC breakfast  . potassium chloride  40 mEq Oral Once   Continuous Infusions: . sodium chloride 100 mL/hr at 07/26/14 1035

## 2014-07-26 NOTE — Progress Notes (Signed)
PT Cancellation Note  Patient Details Name: Donald Mcconnell MRN: [id number] DOB: January 20, 1929   Cancelled Treatment:     We have received another request to do a PT evaluation.  He was seen by me this morning around 11:00.  Please refer to this note.  He will not qualify for SNF because of his cognitive status and from what I can see is that the only thing that will calm him down is to get back home.  He was adamant this morning that he would not stay overnight in the hospital even though his wife told him that she would stay with him.   [name], Delayna Sparlin L 07/26/2014, 3:32 PM

## 2014-07-26 NOTE — Progress Notes (Signed)
UR completed 

## 2014-07-26 NOTE — Care Management Note (Addendum)
    Page 1 of 1   07/27/2014     11:22:48 AM CARE MANAGEMENT NOTE 07/27/2014  Patient:  Donald Mcconnell,Donald Mcconnell   Account Number:  [id number]  Date Initiated:  07/26/2014  Documentation initiated by:  Sharrie RothmanBLACKWELL,Derius Ghosh C  Subjective/Objective Assessment:   Pt admitted from home with dehydration. Pt lives with his wife and son and will return home at discharge. Per pts son, pt was fairly independent wtih ADL's and only required min assist.     Action/Plan:   Will continue to follow for discharge planning needs. Pts wife very anxious to take pt home as soon as possible.   Anticipated DC Date:  07/27/2014   Anticipated DC Plan:  HOME/SELF CARE      DC Planning Services  CM consult      Choice offered to / List presented to:             Status of service:  Completed, signed off Medicare Important Message given?  NA - LOS <3 / Initial given by admissions (If response is "NO", the following Medicare IM given date fields will be blank) Date Medicare IM given:   Medicare IM given by:   Date Additional Medicare IM given:   Additional Medicare IM given by:    Discharge Disposition:  HOME/SELF CARE  Per UR Regulation:    If discussed at Long Length of Stay Meetings, dates discussed:    Comments:  07/27/14 1120 Arlyss Queenammy Pama Roskos, RN BSN CM Pt discharged home today. No CM needs noted.  07/26/14 1445 Arlyss Queenammy Florencio Hollibaugh, RN BSN CM

## 2014-07-26 NOTE — Progress Notes (Addendum)
INITIAL NUTRITION ASSESSMENT Pt meets criteria for Severe MALNUTRITION in the context of Acute as evidenced by loss of >17% bw in 1 month and a reported intake of <50% of estimated energy needs for >5 days.  DOCUMENTATION CODES Per approved criteria  -Severe malnutrition in the context of acute illness or injury   INTERVENTION: -Ensure Pudding po TID, each supplement provides 170 kcal and 4 grams of protein  -recommend mvi   NUTRITION DIAGNOSIS: Inadequate oral intake related to perceived dementia as evidenced by loss of ~30 pounds in 1.5 months  Goal: Pt to meet >/= 90% of their estimated nutrition needs   Monitor:  Oral intake, weight, labs, mental status for a more detailed wt/oral intake hx,   Reason for Assessment: Assessment of nutritional status  81 y.o. male  Admitting Dx: Acute worsening of stage 3 chronic kidney disease  ASSESSMENT: 81 y.o. male that reports symptoms of fever, cough, or upper respiratory congestion which started 5 days ago. Associated with intermittent dizziness, lightheadedness. Very little oral intake during this period of time.  Pt demented at baseline. Spoke to pts family/friends. They said that patient hasnt eaten almost anything since last Thursday. They were unsure of why pt has not eaten. They did not think patient had any Nausea/vomiting or constipation/diarrhea.  When I entered the room pt had eaten most of his breakfast and it seems that his appetite had improved. Told patient that we will monitor his intake and add more supps/snacks as needed. In light of how much wt he has lost will keep ensure pudding TID ordered  Family/friends believe he has lost about 7#s this last month and they thought that pt looks skinnier. Per emr, pt looks to have lost much more ~ 30 pounds in 1.5 months. Would guess that poor PO intake has been going on for much longer than 5 days.   Nutrition Focused Physical Exam:  Subcutaneous Fat:  Orbital Region:  Upper  Arm Region: Mild Thoracic and Lumbar Region:mild  Muscle:  Temple Region: mild Clavicle Bone Region:Mild Clavicle and Acromion Bone Region: Mild Scapular Bone Region:Mild Dorsal Hand: none Patellar Region: none Anterior Thigh Region: none Posterior Calf Region:mild  Edema: none   Height: Ht Readings from Last 1 Encounters:  07/25/14  (1.778 m)    Weight: Wt Readings from Last 1 Encounters:  07/25/14 145 lb 8 oz (65.998 kg)    Ideal Body Weight: 166 lbs  % Ideal Body Weight: 87%  Wt Readings from Last 10 Encounters:  07/25/14 145 lb 8 oz (65.998 kg)  06/13/14 175 lb (79.379 kg)  02/13/13 181 lb 6.4 oz (82.283 kg)    Usual Body Weight: unknown  BMI:  Body mass index is 20.88 kg/(m^2).  Estimated Nutritional Needs: Kcal: 1650-2000 25-30 kcals/kg Protein: 76-86 (1.1-1.3 g/kg) Fluid: 1.6-2 liters  Skin: WDL  Diet Order: Diet Heart  EDUCATION NEEDS: -No education needs identified at this time   Intake/Output Summary (Last 24 hours) at 07/26/14 0818 Last data filed at 07/26/14 1610  Gross per 24 hour  Intake      0 ml  Output      2 ml  Net     -2 ml    Last BM: 2/21   Labs:   Recent Labs Lab 07/25/14 1610 07/26/14 0642  NA 141 143  K 3.7 3.4*  CL 105 109  CO2 26 24  BUN 42* 37*  CREATININE 2.49* 1.86*  CALCIUM 8.8 8.2*  GLUCOSE 120* 101*  CBG (last 3)  No results for input(s): GLUCAP in the last 72 hours.  Scheduled Meds: . sodium chloride   Intravenous STAT  . allopurinol  150 mg Oral Daily  . amLODipine  5 mg Oral Daily  . aspirin EC  81 mg Oral Daily  . atenolol  50 mg Oral Daily  . feeding supplement (ENSURE)  1 Container Oral TID BM  . heparin  5,000 Units Subcutaneous 3 times per day  . levothyroxine  100 mcg Oral QAC breakfast    Continuous Infusions: . sodium chloride 100 mL/hr at 07/25/14 2335    Past Medical History  Diagnosis Date  . Hypertension   . Thyroid disease     hypothyroidism  . Gout   .  Hypothyroidism   . Dementia     Past Surgical History  Procedure Laterality Date  . Colon surgery    . Shoulder surgery    . Eye surgery      cateract surgery  . Tee without cardioversion N/A 02/16/2013    Procedure: TRANSESOPHAGEAL ECHOCARDIOGRAM (TEE);  Surgeon: Wendall StadePeter C Nishan, MD;  [facility];  Service: Cardiovascular;  Laterality: N/A;    Christophe LouisNathan Franks RD, LDN Nutrition Pager: [phone] 07/26/2014 8:19 AM

## 2014-07-26 NOTE — Evaluation (Signed)
Physical Therapy Evaluation Patient Details Name: KALEV TEMME MRN: [id number] DOB: 02-Feb-1929 Today's Date: 07/26/2014   History of Present Illness  Patient reports symptoms of fever, cough, or upper respiratory congestion which started 5 days ago. Associated with intermittent dizziness, lightheadedness. Denies chest pain, shortness of breath, palpitations, nausea, vomiting, diarrhea. Very little oral intake during this period of time. Patient went to PCPs office and was noted to have PVCs on EKG as well as other metabolic abnormalities and was instructed to come to the emergency room.  Clinical Impression   Pt was seen for evaluation, wife present.  She states that pt is "mad" that he can't go home.  He does not want to be here overnight. Pt has a history of advanced dementia however he was able to cooperate with me and follow all instructions.  He is minimally verbal.  His strength, balance and endurance are found to be WNL.  He ambulates with a walker and has good stability.  Wife states that he has been functioning in the home with no problems.  We will ask nursing service to ambulate him while he is here.    Follow Up Recommendations No PT follow up    Equipment Recommendations  None recommended by PT    Recommendations for Other Services   none   Precautions / Restrictions Precautions Precautions: Fall Restrictions Weight Bearing Restrictions: No      Mobility  Bed Mobility Overal bed mobility: Modified Independent                Transfers Overall transfer level: Modified independent Equipment used: Rolling walker (2 wheeled)                Ambulation/Gait Ambulation/Gait assistance: Modified independent (Device/Increase time) Ambulation Distance (Feet): 250 Feet Assistive device: Rolling walker (2 wheeled) Gait Pattern/deviations: WFL(Within Functional Limits)   Gait velocity interpretation: at or above normal speed for age/gender    Stairs            Wheelchair Mobility    Modified Rankin (Stroke Patients Only)       Balance Overall balance assessment: No apparent balance deficits (not formally assessed)                                           Pertinent Vitals/Pain Pain Assessment: No/denies pain    Home Living Family/patient expects to be discharged to:: Private residence Living Arrangements: Spouse/significant other Available Help at Discharge: Family;Available 24 hours/day Type of Home: House Home Access: Stairs to enter Entrance Stairs-Rails: Right;Left;Can reach both Entrance Stairs-Number of Steps: 5 Home Layout: One level Home Equipment: Walker - 2 wheels;Bedside commode      Prior Function Level of Independence: Independent with assistive device(s)               Hand Dominance   Dominant Hand: Right    Extremity/Trunk Assessment   Upper Extremity Assessment: Overall WFL for tasks assessed           Lower Extremity Assessment: Overall WFL for tasks assessed         Communication   Communication: No difficulties  Cognition Arousal/Alertness: Awake/alert Behavior During Therapy: WFL for tasks assessed/performed Overall Cognitive Status: History of cognitive impairments - at baseline                      General Comments  Exercises        Assessment/Plan    PT Assessment Patent does not need any further PT services  PT Diagnosis     PT Problem List    PT Treatment Interventions     PT Goals (Current goals can be found in the Care Plan section) Acute Rehab PT Goals PT Goal Formulation: All assessment and education complete, DC therapy    Frequency     Barriers to discharge  none      Co-evaluation               End of Session Equipment Utilized During Treatment: Gait belt Activity Tolerance: Patient tolerated treatment well Patient left: in bed;with call bell/phone within reach;with bed alarm set Nurse Communication: Mobility  status    Functional Assessment Tool Used: clinical judgement Functional Limitation: Mobility: Walking and moving around Mobility: Walking and Moving Around Current Status ([id number](G8978): At least 1 percent but less than 20 percent impaired, limited or restricted Mobility: Walking and Moving Around Goal Status [phone]([id number]): At least 1 percent but less than 20 percent impaired, limited or restricted Mobility: Walking and Moving Around Discharge Status [phone]): At least 1 percent but less than 20 percent impaired, limited or restricted    Time: 1111-1139 PT Time Calculation (min) (ACUTE ONLY): 28 min   Charges:   PT Evaluation $Initial PT Evaluation Tier I: 1 Procedure     PT G Codes:   PT G-Codes **NOT FOR INPATIENT CLASS** Functional Assessment Tool Used: clinical judgement Functional Limitation: Mobility: Walking and moving around Mobility: Walking and Moving Around Current Status ([id number](G8978): At least 1 percent but less than 20 percent impaired, limited or restricted Mobility: Walking and Moving Around Goal Status [phone]([id number]): At least 1 percent but less than 20 percent impaired, limited or restricted Mobility: Walking and Moving Around Discharge Status [phone]): At least 1 percent but less than 20 percent impaired, limited or restricted    [name], Adriana Lina L 07/26/2014, 11:44 AM

## 2014-07-27 DIAGNOSIS — N3000 Acute cystitis without hematuria: Secondary | ICD-10-CM

## 2014-07-27 DIAGNOSIS — E43 Unspecified severe protein-calorie malnutrition: Secondary | ICD-10-CM

## 2014-07-27 DIAGNOSIS — N390 Urinary tract infection, site not specified: Secondary | ICD-10-CM

## 2014-07-27 DIAGNOSIS — E039 Hypothyroidism, unspecified: Secondary | ICD-10-CM

## 2014-07-27 LAB — BASIC METABOLIC PANEL
ANION GAP: 9 (ref 5–15)
BUN: 27 mg/dL — ABNORMAL HIGH (ref 6–23)
CHLORIDE: 113 mmol/L — AB (ref 96–112)
CO2: 24 mmol/L (ref 19–32)
CREATININE: 1.45 mg/dL — AB (ref 0.50–1.35)
Calcium: 8.5 mg/dL (ref 8.4–10.5)
GFR calc Af Amer: 50 mL/min — ABNORMAL LOW (ref >90)
GFR calc non Af Amer: 43 mL/min — ABNORMAL LOW (ref >90)
Glucose, Bld: 107 mg/dL — ABNORMAL HIGH (ref 70–99)
POTASSIUM: 3.7 mmol/L (ref 3.5–5.1)
Sodium: 146 mmol/L — ABNORMAL HIGH (ref 135–145)

## 2014-07-27 LAB — CBC
HEMATOCRIT: 32.4 % — AB (ref 39.0–52.0)
Hemoglobin: 10.8 g/dL — ABNORMAL LOW (ref 13.0–17.0)
MCH: 30.8 pg (ref 26.0–34.0)
MCHC: 33.3 g/dL (ref 30.0–36.0)
MCV: 92.3 fL (ref 78.0–100.0)
PLATELETS: 197 10*3/uL (ref 150–400)
RBC: 3.51 MIL/uL — ABNORMAL LOW (ref 4.22–5.81)
RDW: 14.9 % (ref 11.5–15.5)
WBC: 6.7 10*3/uL (ref 4.0–10.5)

## 2014-07-27 MED ORDER — CEFUROXIME AXETIL 250 MG PO TABS: 250.0000 mg | ORAL_TABLET | Freq: Two times a day (BID) | ORAL | Status: DC

## 2014-07-27 MED ORDER — SODIUM CHLORIDE 0.45 % IV SOLN
INTRAVENOUS | Status: DC
  Administered 2014-07-27: 09:00:00 via INTRAVENOUS

## 2014-07-27 MED ORDER — ENSURE PUDDING PO PUDG: 1.0000 | Freq: Three times a day (TID) | ORAL | Status: DC

## 2014-07-27 NOTE — Discharge Instructions (Signed)
Dehydration, Adult Dehydration is when you lose more fluids from the body than you take in. Vital organs like the kidneys, brain, and heart cannot function without a proper amount of fluids and salt. Any loss of fluids from the body can cause dehydration.  CAUSES   Vomiting.  Diarrhea.  Excessive sweating.  Excessive urine output.  Fever. SYMPTOMS  Mild dehydration  Thirst.  Dry lips.  Slightly dry mouth. Moderate dehydration  Very dry mouth.  Sunken eyes.  Skin does not bounce back quickly when lightly pinched and released.  Dark urine and decreased urine production.  Decreased tear production.  Headache. Severe dehydration  Very dry mouth.  Extreme thirst.  Rapid, weak pulse (more than 100 beats per minute at rest).  Cold hands and feet.  Not able to sweat in spite of heat and temperature.  Rapid breathing.  Blue lips.  Confusion and lethargy.  Difficulty being awakened.  Minimal urine production.  No tears. DIAGNOSIS  Your caregiver will diagnose dehydration based on your symptoms and your exam. Blood and urine tests will help confirm the diagnosis. The diagnostic evaluation should also identify the cause of dehydration. TREATMENT  Treatment of mild or moderate dehydration can often be done at home by increasing the amount of fluids that you drink. It is best to drink small amounts of fluid more often. Drinking too much at one time can make vomiting worse. Refer to the home care instructions below. Severe dehydration needs to be treated at the hospital where you will probably be given intravenous (IV) fluids that contain water and electrolytes. HOME CARE INSTRUCTIONS   Ask your caregiver about specific rehydration instructions.  Drink enough fluids to keep your urine clear or pale yellow.  Drink small amounts frequently if you have nausea and vomiting.  Eat as you normally do.  Avoid:  Foods or drinks high in sugar.  Carbonated  drinks.  Juice.  Extremely hot or cold fluids.  Drinks with caffeine.  Fatty, greasy foods.  Alcohol.  Tobacco.  Overeating.  Gelatin desserts.  Wash your hands well to avoid spreading bacteria and viruses.  Only take over-the-counter or prescription medicines for pain, discomfort, or fever as directed by your caregiver.  Ask your caregiver if you should continue all prescribed and over-the-counter medicines.  Keep all follow-up appointments with your caregiver. SEEK MEDICAL CARE IF:  You have abdominal pain and it increases or stays in one area (localizes).  You have a rash, stiff neck, or severe headache.  You are irritable, sleepy, or difficult to awaken.  You are weak, dizzy, or extremely thirsty. SEEK IMMEDIATE MEDICAL CARE IF:   You are unable to keep fluids down or you get worse despite treatment.  You have frequent episodes of vomiting or diarrhea.  You have blood or green matter (bile) in your vomit.  You have blood in your stool or your stool looks black and tarry.  You have not urinated in 6 to 8 hours, or you have only urinated a small amount of very dark urine.  You have a fever.  You faint. MAKE SURE YOU:   Understand these instructions.  Will watch your condition.  Will get help right away if you are not doing well or get worse. Document Released: 05/20/2005 Document Revised: 08/12/2011 Document Reviewed: 01/07/2011 Exit[employer] Patient Information 2015 [employer]. This information is not intended to replace advice given to you by your health care provider. Make sure you discuss any questions you have with your health care   provider.  

## 2014-07-27 NOTE — Progress Notes (Signed)
Discharge Instructions and medications explained to patient, wife, and son. Patient and family stated no further questions. Patient stable at discharge. Transported by nurse via W/C to main entrance.

## 2014-07-27 NOTE — Discharge Summary (Signed)
Physician Discharge Summary  Donald Mcconnell JXB:[id number] DOB: Jun 15, 1928 DOA: 07/25/2014  PCP: Lorenda Peck, MD  Admit date: 07/25/2014 Discharge date: 07/27/2014   Recommendations for Outpatient Follow-Up:   1. F/U urine culture results and respiratory virus panel results.   Discharge Diagnosis:   Principal Problem:    Acute worsening of stage 3 chronic kidney disease secondary to dehydration Active Problems:    H/O CVA (cerebral vascular accident)    HTN (hypertension)    Hypothyroidism    History of gout    Alzheimer's dementia    Dehydration    Physical deconditioning    Malnutrition    Viral URI    Protein-calorie malnutrition, severe    UTI   Discharge Condition: Improved.  Diet recommendation: Low sodium, heart healthy.     History of Present Illness:   Donald Mcconnell is an 81 y.o. male with a PMH of CVA, hypertension, alzheimer's dementia, hypothyroidism who was admitted 07/25/14 with a 5 day history of fevers, cough, anorexia and upper respiratory tract congestion. No chest pain, shortness of breath, chest pain, palpitations, abdominal pain, nausea or vomiting. Patient went to PCPs office and was noted to have PVCs on EKG as well as other metabolic abnormalities and was instructed to come to ED for further evaluation. On admission, pt was hemodynamically stable. Blood work showed hemoglobin of 12.7, creatinine 2.49, normal troponin level. 12 lead EKG showed sinus rhythm. UA showed moderate leukocytes, positive nitrites and many bacteria. We started rocephin.   Hospital Course by Problem:   Principal Problem:  Acute worsening of stage 3 chronic kidney disease - Baseline creatinine 1.6 in 06/2014, now back to baseline s/p hydration.  Active Problems: URI - Respiratory status stable.  Asymptomatic today. - Resp viral panel pending.   UTI - UA showed moderate leukocytes, positive nitrites and many bacteria. - On empiric rocephin.  D/C  home on 5 days of Ceftin. - Follow urine culture results.   PVC - Not seen on admission EKG. - No complaints of chest pain. - Troponin WNL.  Alzheimer's dementia - Has had intermittent agitation.  Mood/behavior stable today.  HTN (hypertension) - Continue Norvasc 5 mg daily.  Hypothyroidism - Continue synthroid.  History of gout - Continue allopurinol.  Dehydration - Treated with IV fluids.  Protein calorie malnutrition, severe - Pt meets criteria for Severe MALNUTRITION in the context of Acute as evidenced by loss of >17% bw in 1 month and a reported intake of <50% of estimated energy needs for >5 days. - Continue supplements per dietician recommendations.    Medical Consultants:    None.   Discharge Exam:   Filed Vitals:   07/27/14 1036  BP: 145/67  Pulse: 60  Temp:   Resp:    Filed Vitals:   07/26/14 1438 07/26/14 1523 07/26/14 2128 07/27/14 1036  BP: 136/52 133/45 125/60 145/67  Pulse: 74 58 73 60  Temp: 98 F (36.7 C)  98.4 F (36.9 C)   TempSrc:   Oral   Resp: 18  20   Height:      Weight:      SpO2: 97% 98% 98%     Gen:  NAD Cardiovascular:  RRR, No M/R/G Respiratory: Lungs CTAB Gastrointestinal: Abdomen soft, NT/ND with normal active bowel sounds. Extremities: No C/E/C   The results of significant diagnostics from this hospitalization (including imaging, microbiology, ancillary and laboratory) are listed below for reference.     Procedures and Diagnostic Studies:   Dg  Chest 2 View  07/25/2014   CLINICAL DATA:  81 year old male with abnormal EKG. Cough fever and malaise. Initial encounter.  EXAM: CHEST  2 VIEW  COMPARISON:  02/13/2013 and earlier.  FINDINGS: Larger lung volumes today. Cardiac size within normal limits. Stable mild tortuosity of the thoracic aorta. Other mediastinal contours are within normal limits. Visualized tracheal air column is within normal limits. No pneumothorax or pulmonary edema. No pleural effusion or  consolidation. Calcified atherosclerosis of the aorta. No confluent pulmonary opacity. No acute osseous abnormality identified.  IMPRESSION: No acute cardiopulmonary abnormality.   Electronically Signed   By: Odessa FlemingH  Hall M.D.   On: 07/25/2014 16:43     Labs:   Basic Metabolic Panel:  Recent Labs Lab 07/25/14 1610 07/26/14 0642 07/27/14 0645  NA 141 143 146*  K 3.7 3.4* 3.7  CL 105 109 113*  CO2 26 24 24   GLUCOSE 120* 101* 107*  BUN 42* 37* 27*  CREATININE 2.49* 1.86* 1.45*  CALCIUM 8.8 8.2* 8.5   GFR Estimated Creatinine Clearance: 36 mL/min (by C-G formula based on Cr of 1.45). Liver Function Tests:  Recent Labs Lab 07/25/14 1610 07/26/14 0642  AST 17 14  ALT 14 12  ALKPHOS 58 49  BILITOT 0.7 0.6  PROT 6.9 5.6*  ALBUMIN 3.3* 2.7*    CBC:  Recent Labs Lab 07/25/14 1610 07/26/14 0642 07/27/14 0645  WBC 8.0 6.6 6.7  NEUTROABS 4.2  --   --   HGB 12.7* 10.7* 10.8*  HCT 37.5* 32.4* 32.4*  MCV 93.5 92.3 92.3  PLT 221 181 197   Cardiac Enzymes:  Recent Labs Lab 07/25/14 1610  TROPONINI <0.03     Discharge Instructions:       Discharge Instructions    Call MD for:  extreme fatigue    Complete by:  As directed      Call MD for:  persistant nausea and vomiting    Complete by:  As directed      Call MD for:  temperature >100.4    Complete by:  As directed      Diet - low sodium heart healthy    Complete by:  As directed      Discharge instructions    Complete by:  As directed   You were cared for by Dr. Hillery Aldohristina Erric Machnik  (a hospitalist) during your hospital stay. If you have any questions about your discharge medications or the care you received while you were in the hospital after you are discharged, you can call the unit and ask to speak with the hospitalist on call if the hospitalist that took care of you is not available. Once you are discharged, your primary care physician will handle any further medical issues. Please note that NO REFILLS for any  discharge medications will be authorized once you are discharged, as it is imperative that you return to your primary care physician (or establish a relationship with a primary care physician if you do not have one) for your aftercare needs so that they can reassess your need for medications and monitor your lab values.  Any outstanding tests can be reviewed by your PCP at your follow up visit.  It is also important to review any medicine changes with your PCP.  Please bring these d/c instructions with you to your next visit so your physician can review these changes with you.  If you do not have a primary care physician, you can call [phone] for a physician referral.  It is highly recommended that you obtain a PCP for hospital follow up.     Increase activity slowly    Complete by:  As directed             Medication List    STOP taking these medications        cephALEXin 500 MG capsule  Commonly known as:  KEFLEX     ondansetron 4 MG tablet  Commonly known as:  ZOFRAN      TAKE these medications        allopurinol 300 MG tablet  Commonly known as:  ZYLOPRIM  Take 0.5 tablets (150 mg total) by mouth daily.     amLODipine 5 MG tablet  Commonly known as:  NORVASC  Take 5 mg by mouth daily.     aspirin 81 MG EC tablet  Take 1 tablet (81 mg total) by mouth daily.     atenolol 50 MG tablet  Commonly known as:  TENORMIN  Take 50 mg by mouth daily.     cefUROXime 250 MG tablet  Commonly known as:  CEFTIN  Take 1 tablet (250 mg total) by mouth 2 (two) times daily with a meal.     feeding supplement (ENSURE) Pudg  Take 1 Container by mouth 3 (three) times daily between meals.     levothyroxine 100 MCG tablet  Commonly known as:  SYNTHROID, LEVOTHROID  Take 100 mcg by mouth daily before breakfast.       Follow-up Information    Follow up with ROBERTS, Vernie Ammons, MD.   Specialty:  Internal Medicine   Why:  If symptoms worsen   Contact information:   [address]  [address] [place] [address] [phone]        Time coordinating discharge: 35 minutes.  Signed:  Joliene Salvador  Pager [phone] [facility] 07/27/2014, 11:20 AM

## 2014-07-27 NOTE — Progress Notes (Signed)
PT Screen  Pt dressed and family in room pt is being discharged from hospital.  Pt states he was able to stand and walk without difficulty.

## 2014-07-29 LAB — URINE CULTURE: Colony Count: >=100000

## 2014-10-29 ENCOUNTER — Emergency Department (HOSPITAL_COMMUNITY): Payer: Medicare HMO

## 2014-10-29 ENCOUNTER — Inpatient Hospital Stay (HOSPITAL_COMMUNITY)
Admission: EM | Admit: 2014-10-29 | Discharge: 2014-11-02 | DRG: 872 | Disposition: A | Discharge status: Home / Self Care | Payer: Medicare HMO | Attending: Internal Medicine | Admitting: Internal Medicine

## 2014-10-29 ENCOUNTER — Encounter (HOSPITAL_COMMUNITY): Payer: Self-pay | Admitting: *Deleted

## 2014-10-29 DIAGNOSIS — N183 Chronic kidney disease, stage 3 unspecified: Secondary | ICD-10-CM | POA: Diagnosis present

## 2014-10-29 DIAGNOSIS — N179 Acute kidney failure, unspecified: Secondary | ICD-10-CM

## 2014-10-29 DIAGNOSIS — R509 Fever, unspecified: Secondary | ICD-10-CM | POA: Diagnosis present

## 2014-10-29 DIAGNOSIS — A419 Sepsis, unspecified organism: Secondary | ICD-10-CM | POA: Diagnosis present

## 2014-10-29 DIAGNOSIS — B9620 Unspecified Escherichia coli [E. coli] as the cause of diseases classified elsewhere: Secondary | ICD-10-CM | POA: Diagnosis present

## 2014-10-29 DIAGNOSIS — G309 Alzheimer's disease, unspecified: Secondary | ICD-10-CM | POA: Diagnosis present

## 2014-10-29 DIAGNOSIS — N390 Urinary tract infection, site not specified: Secondary | ICD-10-CM | POA: Diagnosis not present

## 2014-10-29 DIAGNOSIS — Z7982 Long term (current) use of aspirin: Secondary | ICD-10-CM | POA: Diagnosis not present

## 2014-10-29 DIAGNOSIS — I129 Hypertensive chronic kidney disease with stage 1 through stage 4 chronic kidney disease, or unspecified chronic kidney disease: Secondary | ICD-10-CM | POA: Diagnosis present

## 2014-10-29 DIAGNOSIS — E039 Hypothyroidism, unspecified: Secondary | ICD-10-CM | POA: Diagnosis present

## 2014-10-29 DIAGNOSIS — E860 Dehydration: Secondary | ICD-10-CM | POA: Diagnosis not present

## 2014-10-29 DIAGNOSIS — F0280 Dementia in other diseases classified elsewhere without behavioral disturbance: Secondary | ICD-10-CM | POA: Diagnosis present

## 2014-10-29 DIAGNOSIS — I5032 Chronic diastolic (congestive) heart failure: Secondary | ICD-10-CM | POA: Diagnosis present

## 2014-10-29 DIAGNOSIS — R7881 Bacteremia: Secondary | ICD-10-CM | POA: Diagnosis not present

## 2014-10-29 DIAGNOSIS — A4151 Sepsis due to Escherichia coli [E. coli]: Secondary | ICD-10-CM | POA: Diagnosis not present

## 2014-10-29 HISTORY — DX: Urinary tract infection, site not specified: N39.0

## 2014-10-29 LAB — URINALYSIS, ROUTINE W REFLEX MICROSCOPIC
Bilirubin Urine: NEGATIVE
Glucose, UA: NEGATIVE mg/dL
Ketones, ur: NEGATIVE mg/dL
Nitrite: NEGATIVE
Protein, ur: 100 mg/dL — AB
Specific Gravity, Urine: 1.025 (ref 1.005–1.030)
Urobilinogen, UA: 2 mg/dL — ABNORMAL HIGH (ref 0.0–1.0)
pH: 7 (ref 5.0–8.0)

## 2014-10-29 LAB — COMPREHENSIVE METABOLIC PANEL
ALT: 15 U/L — ABNORMAL LOW (ref 17–63)
AST: 30 U/L (ref 15–41)
Albumin: 3.4 g/dL — ABNORMAL LOW (ref 3.5–5.0)
Alkaline Phosphatase: 65 U/L (ref 38–126)
Anion gap: 12 (ref 5–15)
BUN: 44 mg/dL — ABNORMAL HIGH (ref 6–20)
CO2: 24 mmol/L (ref 22–32)
Calcium: 8.7 mg/dL — ABNORMAL LOW (ref 8.9–10.3)
Chloride: 106 mmol/L (ref 101–111)
Creatinine, Ser: 3 mg/dL — ABNORMAL HIGH (ref 0.61–1.24)
GFR calc Af Amer: 21 mL/min — ABNORMAL LOW (ref >60)
GFR calc non Af Amer: 18 mL/min — ABNORMAL LOW (ref >60)
Glucose, Bld: 128 mg/dL — ABNORMAL HIGH (ref 65–99)
Potassium: 4.6 mmol/L (ref 3.5–5.1)
Sodium: 142 mmol/L (ref 135–145)
Total Bilirubin: 1.2 mg/dL (ref 0.3–1.2)
Total Protein: 6.9 g/dL (ref 6.5–8.1)

## 2014-10-29 LAB — CBC WITH DIFFERENTIAL/PLATELET
Basophils Absolute: 0 10*3/uL (ref 0.0–0.1)
Basophils Relative: 0 % (ref 0–1)
Eosinophils Absolute: 0 10*3/uL (ref 0.0–0.7)
Eosinophils Relative: 0 % (ref 0–5)
HCT: 44.2 % (ref 39.0–52.0)
Hemoglobin: 15.3 g/dL (ref 13.0–17.0)
Lymphocytes Relative: 5 % — ABNORMAL LOW (ref 12–46)
Lymphs Abs: 1.1 10*3/uL (ref 0.7–4.0)
MCH: 32.1 pg (ref 26.0–34.0)
MCHC: 34.6 g/dL (ref 30.0–36.0)
MCV: 92.9 fL (ref 78.0–100.0)
Monocytes Absolute: 1.2 10*3/uL — ABNORMAL HIGH (ref 0.1–1.0)
Monocytes Relative: 5 % (ref 3–12)
Neutro Abs: 22.5 10*3/uL — ABNORMAL HIGH (ref 1.7–7.7)
Neutrophils Relative %: 90 % — ABNORMAL HIGH (ref 43–77)
Platelets: 156 10*3/uL (ref 150–400)
RBC: 4.76 MIL/uL (ref 4.22–5.81)
RDW: 15.4 % (ref 11.5–15.5)
WBC: 24.8 10*3/uL — ABNORMAL HIGH (ref 4.0–10.5)

## 2014-10-29 LAB — TROPONIN I: Troponin I: 0.04 ng/mL — ABNORMAL HIGH (ref <0.031)

## 2014-10-29 LAB — MAGNESIUM: Magnesium: 1.6 mg/dL — ABNORMAL LOW (ref 1.7–2.4)

## 2014-10-29 LAB — URINE MICROSCOPIC-ADD ON

## 2014-10-29 LAB — LACTIC ACID, PLASMA
Lactic Acid, Venous: 2.7 mmol/L (ref 0.5–2.0)
Lactic Acid, Venous: 1.4 mmol/L (ref 0.5–2.0)

## 2014-10-29 MED ORDER — CEFTRIAXONE SODIUM IN DEXTROSE 20 MG/ML IV SOLN
1.0000 g | INTRAVENOUS | Status: DC
  Filled 2014-10-29: qty 50

## 2014-10-29 MED ORDER — TRAMADOL HCL 50 MG PO TABS
50.0000 mg | ORAL_TABLET | Freq: Four times a day (QID) | ORAL | Status: DC | PRN
  Administered 2014-10-30 (×2): 50 mg via ORAL
  Filled 2014-10-29 (×3): qty 1

## 2014-10-29 MED ORDER — ENOXAPARIN SODIUM 30 MG/0.3ML ~~LOC~~ SOLN
30.0000 mg | SUBCUTANEOUS | Status: DC
  Administered 2014-10-29 – 2014-10-30 (×2): 30 mg via SUBCUTANEOUS
  Filled 2014-10-29 (×2): qty 0.3

## 2014-10-29 MED ORDER — SODIUM CHLORIDE 0.9 % IV SOLN
1000.0000 mL | Freq: Once | INTRAVENOUS | Status: AC
  Administered 2014-10-29: 1000 mL via INTRAVENOUS

## 2014-10-29 MED ORDER — ASPIRIN 81 MG PO CHEW
324.0000 mg | CHEWABLE_TABLET | Freq: Once | ORAL | Status: AC
  Administered 2014-10-29: 324 mg via ORAL
  Filled 2014-10-29: qty 4

## 2014-10-29 MED ORDER — ACETAMINOPHEN 500 MG PO TABS
1000.0000 mg | ORAL_TABLET | Freq: Once | ORAL | Status: AC
  Administered 2014-10-29: 1000 mg via ORAL
  Filled 2014-10-29: qty 2

## 2014-10-29 MED ORDER — ASPIRIN EC 81 MG PO TBEC
81.0000 mg | DELAYED_RELEASE_TABLET | Freq: Every day | ORAL | Status: DC
  Administered 2014-10-30 – 2014-11-02 (×4): 81 mg via ORAL
  Filled 2014-10-29 (×4): qty 1

## 2014-10-29 MED ORDER — ALLOPURINOL 300 MG PO TABS
300.0000 mg | ORAL_TABLET | Freq: Every day | ORAL | Status: DC
  Administered 2014-10-30 – 2014-11-02 (×4): 300 mg via ORAL
  Filled 2014-10-29 (×4): qty 1

## 2014-10-29 MED ORDER — SODIUM CHLORIDE 0.9 % IJ SOLN
3.0000 mL | Freq: Two times a day (BID) | INTRAMUSCULAR | Status: DC
  Administered 2014-10-29 – 2014-11-01 (×3): 3 mL via INTRAVENOUS

## 2014-10-29 MED ORDER — MAGNESIUM SULFATE 2 GM/50ML IV SOLN
2.0000 g | Freq: Once | INTRAVENOUS | Status: AC
  Administered 2014-10-29: 2 g via INTRAVENOUS
  Filled 2014-10-29: qty 50

## 2014-10-29 MED ORDER — ONDANSETRON HCL 4 MG/2ML IJ SOLN: 4.0000 mg | Freq: Four times a day (QID) | INTRAMUSCULAR | Status: DC | PRN

## 2014-10-29 MED ORDER — CEFTRIAXONE SODIUM 1 G IJ SOLR
1.0000 g | Freq: Once | INTRAMUSCULAR | Status: AC
  Administered 2014-10-29: 1 g via INTRAVENOUS
  Filled 2014-10-29: qty 10

## 2014-10-29 MED ORDER — ONDANSETRON HCL 4 MG PO TABS: 4.0000 mg | ORAL_TABLET | Freq: Four times a day (QID) | ORAL | Status: DC | PRN

## 2014-10-29 MED ORDER — SODIUM CHLORIDE 0.9 % IV SOLN
INTRAVENOUS | Status: DC
  Administered 2014-10-29 – 2014-11-01 (×3): via INTRAVENOUS
  Administered 2014-11-02: 75 mL/h via INTRAVENOUS

## 2014-10-29 MED ORDER — ACETAMINOPHEN 325 MG PO TABS
650.0000 mg | ORAL_TABLET | Freq: Four times a day (QID) | ORAL | Status: DC | PRN
  Administered 2014-10-30: 650 mg via ORAL
  Filled 2014-10-29: qty 2

## 2014-10-29 MED ORDER — LEVOTHYROXINE SODIUM 100 MCG PO TABS
100.0000 ug | ORAL_TABLET | Freq: Every day | ORAL | Status: DC
  Administered 2014-10-30 – 2014-11-02 (×4): 100 ug via ORAL
  Filled 2014-10-29 (×4): qty 1

## 2014-10-29 MED ORDER — SODIUM CHLORIDE 0.9 % IV BOLUS (SEPSIS)
1000.0000 mL | Freq: Once | INTRAVENOUS | Status: AC
  Administered 2014-10-29: 1000 mL via INTRAVENOUS

## 2014-10-29 MED ORDER — ALUM & MAG HYDROXIDE-SIMETH 200-200-20 MG/5ML PO SUSP: 30.0000 mL | Freq: Four times a day (QID) | ORAL | Status: DC | PRN

## 2014-10-29 MED ORDER — ACETAMINOPHEN 650 MG RE SUPP
650.0000 mg | Freq: Four times a day (QID) | RECTAL | Status: DC | PRN
Start: 2014-10-29 — End: 2014-11-02

## 2014-10-29 MED ORDER — ALPRAZOLAM 1 MG PO TABS
1.0000 mg | ORAL_TABLET | Freq: Three times a day (TID) | ORAL | Status: DC | PRN
  Administered 2014-11-01 – 2014-11-02 (×3): 1 mg via ORAL
  Filled 2014-10-29 (×3): qty 1

## 2014-10-29 NOTE — ED Provider Notes (Signed)
CSN: [id number]     Arrival date & time 10/29/14  1458 History   First MD Initiated Contact with Patient 10/29/14 1519     Chief Complaint  Patient presents with  . Emesis  . Diarrhea     (Consider location/radiation/quality/duration/timing/severity/associated sxs/prior Treatment) HPI   86yM with anorexia and generalized weakness. History primarily from wife and granddaughter. Pt has  A history of dementia and is not a great historian. On Wednesday he had vomiting and diarrhea. This subsided within about 24 hours but since then pt has not been eating or drinking. Generalized weakness. Lives at home and normally ambulatory and can participate in some ADLs but today couldn't get out of bed. Granddaughter reports admission this past February with similar symptoms and had UTI/dehydrated.  Past Medical History  Diagnosis Date  . Hypertension   . Thyroid disease     hypothyroidism  . Gout   . Hypothyroidism   . Dementia   . UTI (lower urinary tract infection)    Past Surgical History  Procedure Laterality Date  . Colon surgery    . Shoulder surgery    . Eye surgery      cateract surgery  . Tee without cardioversion N/A 02/16/2013    Procedure: TRANSESOPHAGEAL ECHOCARDIOGRAM (TEE);  Surgeon: Wendall StadePeter C Nishan, MD;  [facility];  Service: Cardiovascular;  Laterality: N/A;   Family History  Problem Relation Age of Onset  . Prostate cancer Brother    History  Substance Use Topics  . Smoking status: Never Smoker   . Smokeless tobacco: Not on file  . Alcohol Use: Yes     Comment: socially - none since 2010    Review of Systems  Level 5 caveat because of dementia.   Allergies  Review of patient's allergies indicates no known allergies.  Home Medications   Prior to Admission medications   Medication Sig Start Date End Date Taking? Authorizing Provider  allopurinol (ZYLOPRIM) 300 MG tablet Take 0.5 tablets (150 mg total) by mouth daily. 02/16/13   Esperanza SheetsUlugbek N Buriev, MD   amLODipine (NORVASC) 5 MG tablet Take 5 mg by mouth daily.    Historical Provider, MD  aspirin EC 81 MG EC tablet Take 1 tablet (81 mg total) by mouth daily. 02/16/13   Esperanza SheetsUlugbek N Buriev, MD  atenolol (TENORMIN) 50 MG tablet Take 50 mg by mouth daily.    Historical Provider, MD  cefUROXime (CEFTIN) 250 MG tablet Take 1 tablet (250 mg total) by mouth 2 (two) times daily with a meal. 07/27/14   Christina P Rama, MD  feeding supplement, ENSURE, (ENSURE) PUDG Take 1 Container by mouth 3 (three) times daily between meals. 07/27/14   Maryruth Bunhristina P Rama, MD  levothyroxine (SYNTHROID, LEVOTHROID) 100 MCG tablet Take 100 mcg by mouth daily before breakfast.    Historical Provider, MD   BP 156/109 mmHg  Pulse 135  Temp(Src) 102.1 F (38.9 C) (Rectal)  Resp 35  SpO2 95% Physical Exam  Constitutional: No distress.  HENT:  Head: Normocephalic and atraumatic.  Eyes: Conjunctivae and EOM are normal. Right eye exhibits no discharge. Left eye exhibits no discharge.  Neck: Neck supple.  Cardiovascular: Regular rhythm and normal heart sounds.  Exam reveals no gallop and no friction rub.   No murmur heard. tachycardic  Pulmonary/Chest:  Tachypneic, but breath sounds clear  Abdominal: Soft. He exhibits no distension. There is tenderness. There is no rebound.  Mild diffuse tenderness  Musculoskeletal: He exhibits no edema or tenderness.  Neurological:  He is alert. No cranial nerve deficit.  Generalized weakness. No focal motor deficit  Skin: Skin is warm and dry.  Psychiatric: His behavior is normal. Thought content normal.  Nursing note and vitals reviewed.   ED Course  Procedures (including critical care time) Labs Review Labs Reviewed  CBC WITH DIFFERENTIAL/PLATELET - Abnormal; Notable for the following:    WBC 24.8 (*)    Neutrophils Relative % 90 (*)    Neutro Abs 22.5 (*)    Lymphocytes Relative 5 (*)    Monocytes Absolute 1.2 (*)    All other components within normal limits  COMPREHENSIVE  METABOLIC PANEL - Abnormal; Notable for the following:    Glucose, Bld 128 (*)    BUN 44 (*)    Creatinine, Ser 3.00 (*)    Calcium 8.7 (*)    Albumin 3.4 (*)    ALT 15 (*)    GFR calc non Af Amer 18 (*)    GFR calc Af Amer 21 (*)    All other components within normal limits  TROPONIN I - Abnormal; Notable for the following:    Troponin I 0.04 (*)    All other components within normal limits  URINALYSIS, ROUTINE W REFLEX MICROSCOPIC (NOT AT [facility]) - Abnormal; Notable for the following:    APPearance CLOUDY (*)    Hgb urine dipstick LARGE (*)    Protein, ur 100 (*)    Urobilinogen, UA 2.0 (*)    Leukocytes, UA MODERATE (*)    All other components within normal limits  LACTIC ACID, PLASMA - Abnormal; Notable for the following:    Lactic Acid, Venous 2.7 (*)    All other components within normal limits  MAGNESIUM - Abnormal; Notable for the following:    Magnesium 1.6 (*)    All other components within normal limits  URINE MICROSCOPIC-ADD ON - Abnormal; Notable for the following:    Bacteria, UA MANY (*)    All other components within normal limits  CULTURE, BLOOD (ROUTINE X 2)  CULTURE, BLOOD (ROUTINE X 2)  URINE CULTURE  LACTIC ACID, PLASMA    Imaging Review Dg Chest Port 1 View  10/29/2014   CLINICAL DATA:  Nausea/vomiting/diarrhea, fever  EXAM: PORTABLE CHEST - 1 VIEW  COMPARISON:  07/25/2014  FINDINGS: Mild patchy bilateral lower lobe opacities, atelectasis versus pneumonia. No pleural effusion or pneumothorax.  The heart is normal in size.  IMPRESSION: Mild patchy bilateral lower lobe opacities, atelectasis versus pneumonia.   Electronically Signed   By: Charline Bills M.D.   On: 10/29/2014 17:16     EKG Interpretation   Date/Time:  Saturday Oct 29 2014 15:14:52 EDT Ventricular Rate:  135 PR Interval:  152 QRS Duration: 90 QT Interval:  264 QTC Calculation: 396 R Axis:   -152 Text Interpretation:  Suspect arm lead reversal, interpretation  assumes no reversal  Sinus tachycardia Anterolateral infarct , age  undetermined Abnormal ECG Confirmed by Juleen China  MD, Mickenzie Stolar ([id number]) on  10/29/2014 3:19:11 PM      MDM   Final diagnoses:  Fever  UTI (lower urinary tract infection)  AKI (acute kidney injury)  Dehydration    86yF with anorexia and generalized weakness. Clinically dehydrated. Appears dry, tachycardic, recent v/d and now anorexia. IVF. Febrile. Hard to get good ROS with dementia. Will check UA, CXR, blood cultures. Normotensive.   Has UTI. Rocephin. Urine culture. CXR with patchy infiltrate versus atelectasis. No specific respiratory complaints. Further abx deferred. HR improving. Pt currently resting comfortably.  Discussed with medicine for admission.   Raeford Razor, MD 10/29/14 [phone]

## 2014-10-29 NOTE — H&P (Signed)
History and Physical  Donald Mcconnell ZOX:[id number] DOB: [birth year] DOA: 10/29/2014  Referring physician: Dr Juleen ChinaKohut, ED physician PCP: Lorenda PeckOBERTS, RONALD WAYNE, MD   Chief Complaint: Decreased appetite, fever  HPI: Donald Mcconnell is a 81 y.o. male  With a history of hypertension, hypothyroidism, gout, Alzheimer's.  In February he was hospitalized for a urinary tract infection. He was hospitalized and treated for 2 days and then discharged. The history is provided by his wife due to dementia. The patient started having vomiting and diarrhea Wednesday night, then proceeded to have fever and decreased appetite since Thursday morning. His fever and appetite did not improve. Finally, the patient was brought to the hospital by his wife due to symptoms not improving. Tylenol did improve his fever briefly. No other provoking factors. The vomiting has resolved    Review of Systems:   Unable to obtain due to dementia  Past Medical History  Diagnosis Date  . Hypertension   . Thyroid disease     hypothyroidism  . Gout   . Hypothyroidism   . Dementia   . UTI (lower urinary tract infection)    Past Surgical History  Procedure Laterality Date  . Colon surgery    . Shoulder surgery    . Eye surgery      cateract surgery  . Tee without cardioversion N/A 02/16/2013    Procedure: TRANSESOPHAGEAL ECHOCARDIOGRAM (TEE);  Surgeon: Wendall StadePeter C Nishan, MD;  [facility];  Service: Cardiovascular;  Laterality: N/A;   Social History:  reports that he has never smoked. He does not have any smokeless tobacco history on file. He reports that he drinks alcohol. He reports that he does not use illicit drugs. Patient lives at home & is able to participate in activities of daily living   No Known Allergies  Family History  Problem Relation Age of Onset  . Prostate cancer Brother       Prior to Admission medications   Medication Sig Start Date End Date Taking? Authorizing Provider  allopurinol (ZYLOPRIM)  300 MG tablet Take 300 mg by mouth daily.   Yes Historical Provider, MD  ALPRAZolam Prudy Feeler(XANAX) 1 MG tablet Take 1 mg by mouth as needed for anxiety.   Yes Historical Provider, MD  aspirin EC 81 MG EC tablet Take 1 tablet (81 mg total) by mouth daily. 02/16/13  Yes Esperanza SheetsUlugbek N Buriev, MD  levothyroxine (SYNTHROID, LEVOTHROID) 100 MCG tablet Take 100 mcg by mouth daily before breakfast.   Yes Historical Provider, MD  traMADol (ULTRAM) 50 MG tablet Take 50 mg by mouth every 6 (six) hours as needed for moderate pain.   Yes Historical Provider, MD    Physical Exam: BP 156/109 mmHg  Pulse 135  Temp(Src) 102.1 F (38.9 C) (Rectal)  Resp 35  SpO2 95%  General: Elderly Caucasian male. Arousable, but only oriented to self. No acute cardiopulmonary distress.  Eyes: Pupils equal, round, reactive to light. Extraocular muscles are intact. Sclerae anicteric and noninjected.  ENT:  Dry mucosal membranes. No mucosal lesions.   Neck: Neck supple without lymphadenopathy. No carotid bruits. No masses palpated.  Cardiovascular: Regular rate with normal S1-S2 sounds. No murmurs, rubs, gallops auscultated. No JVD.  Respiratory: Good respiratory effort with no wheezes, rales, rhonchi. Lungs clear to auscultation bilaterally.  Abdomen: Soft, nontender, nondistended. Active bowel sounds. No masses or hepatosplenomegaly  Skin: Dry, warm to touch. 2+ dorsalis pedis and radial pulses. Musculoskeletal: No calf or leg pain. All major joints not erythematous nontender.  Psychiatric:  Intact judgment and insight.  Neurologic: No focal neurological deficits. Cranial nerves II through XII are grossly intact.           Labs on Admission:  Basic Metabolic Panel:  Recent Labs Lab 10/29/14 1533  NA 142  K 4.6  CL 106  CO2 24  GLUCOSE 128*  BUN 44*  CREATININE 3.00*  CALCIUM 8.7*  MG 1.6*   Liver Function Tests:  Recent Labs Lab 10/29/14 1533  AST 30  ALT 15*  ALKPHOS 65  BILITOT 1.2  PROT 6.9  ALBUMIN 3.4*    No results for input(s): LIPASE, AMYLASE in the last 168 hours. No results for input(s): AMMONIA in the last 168 hours. CBC:  Recent Labs Lab 10/29/14 1533  WBC 24.8*  NEUTROABS 22.5*  HGB 15.3  HCT 44.2  MCV 92.9  PLT 156   Cardiac Enzymes:  Recent Labs Lab 10/29/14 1533  TROPONINI 0.04*    BNP (last 3 results) No results for input(s): BNP in the last 8760 hours.  ProBNP (last 3 results) No results for input(s): PROBNP in the last 8760 hours.  CBG: No results for input(s): GLUCAP in the last 168 hours.  Radiological Exams on Admission: Dg Chest Port 1 View  10/29/2014   CLINICAL DATA:  Nausea/vomiting/diarrhea, fever  EXAM: PORTABLE CHEST - 1 VIEW  COMPARISON:  07/25/2014  FINDINGS: Mild patchy bilateral lower lobe opacities, atelectasis versus pneumonia. No pleural effusion or pneumothorax.  The heart is normal in size.  IMPRESSION: Mild patchy bilateral lower lobe opacities, atelectasis versus pneumonia.   Electronically Signed   By: Charline Bills M.D.   On: 10/29/2014 17:16    EKG: Independently reviewed. Sinus tachycardia. Normal PR and QRS intervals.  Q waves in V2 V3 suggestive of anteriolateral MI  Assessment/Plan Present on Admission:  . UTI (lower urinary tract infection) . Dehydration . Acute renal failure superimposed on stage 3 chronic kidney disease  This patient was discussed with the ED physician, including pertinent vitals, physical exam findings, labs, and imaging.  We also discussed care given by the ED provider.  #1 UTI #2 dehydration  #3 acute renal failure superimposed on stage III chronic kidney disease #4 Alzheimer's #5 hypertension #6 hypothyroidism #7 grade 2 diastolic heart failure  Admit the patient to telemetry Continue IV fluids cautiously due to chronic heart failure Continue ceftriaxone - last culture showed Escherichia coli sensitive to ceftriaxone Recheck CBC and metabolic panel in the morning Blood cultures and urine  cultures pending  DVT prophylaxis: Lovenox  Consultants: None  Code Status: Full code  Family Communication: Wife in the room   Disposition Plan: Home following hospitalization   Levie Heritage, DO [facility] Pager [phone]

## 2014-10-29 NOTE — ED Notes (Signed)
5 day history of n/v/d. Stopped vomiting Thursday, but has not had any PO intake since Wednesday. Still urinating.  Was hospitalized 2 months ago with UTI. Patient less alert, lethargic.  Has been just lying in bed.

## 2014-10-29 NOTE — ED Notes (Signed)
CRITICAL VALUE ALERT  Critical value received: lactic acid  Date of notification:  2.7  Time of notification:  1615  Critical value read back:Yes.    Nurse who received alert:  Dorris Fetchaphyne Anderson, RN  MD notified (1st page):  Dr Juleen ChinaKohut  Time of first page:  [phone]

## 2014-10-30 DIAGNOSIS — R7881 Bacteremia: Secondary | ICD-10-CM | POA: Insufficient documentation

## 2014-10-30 LAB — BASIC METABOLIC PANEL
Anion gap: 10 (ref 5–15)
BUN: 42 mg/dL — AB (ref 6–20)
CALCIUM: 8.1 mg/dL — AB (ref 8.9–10.3)
CHLORIDE: 109 mmol/L (ref 101–111)
CO2: 23 mmol/L (ref 22–32)
Creatinine, Ser: 2.56 mg/dL — ABNORMAL HIGH (ref 0.61–1.24)
GFR calc Af Amer: 25 mL/min — ABNORMAL LOW (ref >60)
GFR calc non Af Amer: 21 mL/min — ABNORMAL LOW (ref >60)
Glucose, Bld: 106 mg/dL — ABNORMAL HIGH (ref 65–99)
Potassium: 4 mmol/L (ref 3.5–5.1)
SODIUM: 142 mmol/L (ref 135–145)

## 2014-10-30 LAB — CBC
HCT: 38.1 % — ABNORMAL LOW (ref 39.0–52.0)
HEMOGLOBIN: 12.6 g/dL — AB (ref 13.0–17.0)
MCH: 31.1 pg (ref 26.0–34.0)
MCHC: 33.1 g/dL (ref 30.0–36.0)
MCV: 94.1 fL (ref 78.0–100.0)
Platelets: 158 10*3/uL (ref 150–400)
RBC: 4.05 MIL/uL — AB (ref 4.22–5.81)
RDW: 15.5 % (ref 11.5–15.5)
WBC: 16.1 10*3/uL — ABNORMAL HIGH (ref 4.0–10.5)

## 2014-10-30 MED ORDER — DEXTROSE 5 % IV SOLN
1.0000 g | INTRAVENOUS | Status: DC
  Administered 2014-10-30 – 2014-10-31 (×2): 1 g via INTRAVENOUS
  Filled 2014-10-30 (×3): qty 10

## 2014-10-30 NOTE — Progress Notes (Signed)
  PROGRESS NOTE  Donald Mcconnell [name] DOB: 04-12-1929 DOA: 10/29/2014 PCP: Donald Mcconnell  Summary: 81 year old man with history of UTI in the past, presented with vomiting, diarrhea, decreased oral intake and fever. He was admitted for acute kidney injury, UTI, dehydration.  Assessment/Plan: 1. GNR bacteremia, presumed UTI, possible early sepsis. Hemodynamics stable. 2. AKI superimposed on CKD stage III. Improved with IVF. 3. Chronic diastolic CHF, appears well-compensated. 4. Dementia   Overall improving. Plan continue ceftriaxone pending BC and UC results.  Continue IVF. Discontinue telemetry.  Check CBC and BMP in AM.  Likely home in 48 hours.  Discussed with wife and son at bedside, reviewed treatment and labs.  Code Status: full code DVT prophylaxis: Lovenox Family Communication:  Disposition Plan: home  Donald Sacksaniel Goodrich, Mcconnell  [facility]  Pager [phone] If 7PM-7AM, please contact night-coverage at [email or web address], password [email or web address] 10/30/2014, 2:21 PM  LOS: 1 day   Consultants:    Procedures:    Antibiotics:  Ceftriaxone 5/28 >>  HPI/Subjective: Doing well per family. Patient has dementia and history is unreliable. Eating.   Objective: Filed Vitals:   10/29/14 1800 10/29/14 1830 10/29/14 2005 10/30/14 0541  BP: 151/76 130/89 135/70 180/86  Pulse: 87 92 84 90  Temp:   98.2 F (36.8 C) 98.9 F (37.2 C)  TempSrc:   Oral Oral  Resp: 22 24 20 20   Weight:   69.219 kg (152 lb 9.6 oz)   SpO2: 96% 95% 95% 97%    Intake/Output Summary (Last 24 hours) at 10/30/14 1421 Last data filed at 10/30/14 1308  Gross per 24 hour  Intake      0 ml  Output   1050 ml  Net  -1050 ml     Filed Weights   10/29/14 2005  Weight: 69.219 kg (152 lb 9.6 oz)    Exam:     Afebrile, vital signs stable, no hypoxia. General: Appears calm and comfortable Cardiovascular: RRR, no m/r/g. No LE edema. Telemetry: SR, no arrhythmias  Respiratory: CTA  bilaterally, no w/r/r. Normal respiratory effort. Abdomen: soft, ntnd Musculoskeletal: grossly normal tone BUE/BLE  New data reviewed:  BUN and creatinine somewhat improved, 42/2.56. Baseline appears to be around 1.5-3.  WBC significantly improved, 16.1.  Pertinent data since admission  Urinalysis grossly positive   CXR patchy bibasilar atelectasis  Pending  urine culture  Blood cultures gram-negative rods 2/2   Scheduled Meds: . allopurinol  300 mg Oral Daily  . aspirin EC  81 mg Oral Daily  . cefTRIAXone (ROCEPHIN)  IV  1 g Intravenous Q24H  . enoxaparin (LOVENOX) injection  30 mg Subcutaneous Q24H  . levothyroxine  100 mcg Oral QAC breakfast  . sodium chloride  3 mL Intravenous Q12H   Continuous Infusions: . sodium chloride 75 mL/hr at 10/30/14 [id number]    Active Problems:   Dehydration   UTI (lower urinary tract infection)   Acute renal failure superimposed on stage 3 chronic kidney disease   Time spent 20 minutes

## 2014-10-30 NOTE — Progress Notes (Signed)
Patient critical lab aerobic and anaerobic culture positive for gram negative rods. Dr. Irene LimboGoodrich notified.

## 2014-10-31 DIAGNOSIS — A419 Sepsis, unspecified organism: Secondary | ICD-10-CM

## 2014-10-31 DIAGNOSIS — N179 Acute kidney failure, unspecified: Secondary | ICD-10-CM

## 2014-10-31 LAB — BASIC METABOLIC PANEL
ANION GAP: 10 (ref 5–15)
BUN: 41 mg/dL — ABNORMAL HIGH (ref 6–20)
CHLORIDE: 107 mmol/L (ref 101–111)
CO2: 24 mmol/L (ref 22–32)
Calcium: 8.6 mg/dL — ABNORMAL LOW (ref 8.9–10.3)
Creatinine, Ser: 2.62 mg/dL — ABNORMAL HIGH (ref 0.61–1.24)
GFR, EST AFRICAN AMERICAN: 24 mL/min — AB (ref >60)
GFR, EST NON AFRICAN AMERICAN: 21 mL/min — AB (ref >60)
Glucose, Bld: 106 mg/dL — ABNORMAL HIGH (ref 65–99)
Potassium: 3.9 mmol/L (ref 3.5–5.1)
SODIUM: 141 mmol/L (ref 135–145)

## 2014-10-31 LAB — CBC
HEMATOCRIT: 37.8 % — AB (ref 39.0–52.0)
HEMOGLOBIN: 12.6 g/dL — AB (ref 13.0–17.0)
MCH: 31.3 pg (ref 26.0–34.0)
MCHC: 33.3 g/dL (ref 30.0–36.0)
MCV: 94 fL (ref 78.0–100.0)
PLATELETS: 157 10*3/uL (ref 150–400)
RBC: 4.02 MIL/uL — ABNORMAL LOW (ref 4.22–5.81)
RDW: 15.6 % — ABNORMAL HIGH (ref 11.5–15.5)
WBC: 17.7 10*3/uL — ABNORMAL HIGH (ref 4.0–10.5)

## 2014-10-31 MED ORDER — NYSTATIN 100000 UNIT/ML MT SUSP
5.0000 mL | Freq: Four times a day (QID) | OROMUCOSAL | Status: DC
  Administered 2014-10-31 – 2014-11-02 (×8): 500000 [IU] via ORAL
  Filled 2014-10-31 (×8): qty 5

## 2014-10-31 NOTE — Progress Notes (Signed)
Notified mid-level of patient's hypertension.  Patient in bed, with no signs of distress.  Received no new orders at this time.  Will continue to monitor patient.

## 2014-10-31 NOTE — Progress Notes (Addendum)
  PROGRESS NOTE  Randie HeinzBobby F Fread VHQ:[id number] DOB: 03-Nov-1928 DOA: 10/29/2014 PCP: Lorenda PeckOBERTS, RONALD WAYNE, MD  Summary: 81 year old man with history of UTI in the past, presented with vomiting, diarrhea, decreased oral intake and fever. He was admitted for acute kidney injury, UTI, dehydration.  Assessment/Plan: 1. E. coli bacteremia, presumed UTI, possible early sepsis. Hemodynamics stable. Sensitivities pending. 2. AKI superimposed on CKD stage III. No significant change today. Still above baseline.  3. Chronic diastolic CHF, appears well-compensated. 4. Dementia   Overall stable.  Plan to continue abx and f/u culture data  Check bladder scan and continue IVF. BMP in am.  May be able to go home in next 48 hours  Code Status: full code DVT prophylaxis: Lovenox Family Communication:  Disposition Plan: home  Brendia Sacksaniel Josias Tomerlin, MD  [facility]  Pager [phone] If 7PM-7AM, please contact night-coverage at [email or web address], password [email or web address] 10/31/2014, 12:20 PM  LOS: 2 days   Consultants:    Procedures:    Antibiotics:  Ceftriaxone 5/28 >>  HPI/Subjective: Doing ok, wife and son at bedside. Complaining of mouth pain. Seems to be getting back to his "usual self" per son. Voiding well per son.  Objective: Filed Vitals:   10/30/14 1715 10/30/14 2100 10/30/14 2248 10/31/14 0645  BP: 180/83 177/80 182/84 184/79  Pulse: 80 84 82 70  Temp: 98.6 F (37 C) 98.6 F (37 C) 98.6 F (37 C) 98 F (36.7 C)  TempSrc: Oral Oral Oral Oral  Resp: 18  18 18   Weight:      SpO2: 100% 94% 96% 95%    Intake/Output Summary (Last 24 hours) at 10/31/14 1220 Last data filed at 10/31/14 0800  Gross per 24 hour  Intake   2685 ml  Output   1500 ml  Net   1185 ml     Filed Weights   10/29/14 2005  Weight: 69.219 kg (152 lb 9.6 oz)    Exam:    Afebrile 24 hours, vital signs stable, no hypoxia General:  Appears comfortable, calm. Cardiovascular: Regular rate and rhythm, no  murmur, rub or gallop. No lower extremity edema. Respiratory: Clear to auscultation bilaterally, no wheezes, rales or rhonchi. Normal respiratory effort. Abdomen: soft, ntnd Musculoskeletal: grossly normal tone bilateral upper and lower extremities Psychiatric: confused at baseline  New data reviewed:  UOP 1500  BUN and creatinine without significant change, 41/2.62. Baseline appears to be around 1.5-3.  WBC without significant change, 17.7  Pertinent data since admission  Urinalysis grossly positive   CXR patchy bibasilar atelectasis  Pending  urine culture  Blood cultures gram-E. coli 2/2, sensitivities pending   Scheduled Meds: . allopurinol  300 mg Oral Daily  . aspirin EC  81 mg Oral Daily  . cefTRIAXone (ROCEPHIN)  IV  1 g Intravenous Q24H  . enoxaparin (LOVENOX) injection  30 mg Subcutaneous Q24H  . levothyroxine  100 mcg Oral QAC breakfast  . sodium chloride  3 mL Intravenous Q12H   Continuous Infusions: . sodium chloride 75 mL/hr at 10/30/14 [id number]    Principal Problem:   Bacteremia Active Problems:   UTI (lower urinary tract infection)   Acute renal failure superimposed on stage 3 chronic kidney disease   AKI (acute kidney injury)   Sepsis   Time spent 20 minutes

## 2014-11-01 LAB — CULTURE, BLOOD (ROUTINE X 2)

## 2014-11-01 LAB — URINE CULTURE: Colony Count: >=100000

## 2014-11-01 MED ORDER — METOPROLOL TARTRATE 25 MG PO TABS
25.0000 mg | ORAL_TABLET | Freq: Two times a day (BID) | ORAL | Status: DC
  Administered 2014-11-01 – 2014-11-02 (×3): 25 mg via ORAL
  Filled 2014-11-01 (×3): qty 1

## 2014-11-01 MED ORDER — HYDRALAZINE HCL 25 MG PO TABS: 25.0000 mg | ORAL_TABLET | Freq: Four times a day (QID) | ORAL | Status: DC | PRN

## 2014-11-01 MED ORDER — DEXTROSE 5 % IV SOLN
2.0000 g | INTRAVENOUS | Status: DC
  Administered 2014-11-01: 2 g via INTRAVENOUS
  Filled 2014-11-01 (×2): qty 2

## 2014-11-01 NOTE — Care Management Note (Signed)
Case Management Note  Patient Details  Name: Randie HeinzBobby F Achee MRN: [id number] Date of Birth: [birth year]  Subjective/Objective:                  Pt admitted from home with UTI. Pt lives with his wife and will return home at discharge. Pt is independent with ADL's. Pt does have dementia and requires frequent direction. Pt has walker, BSC for home use.  Action/Plan: Pts wife stated that they are no discharge needs noted.   Expected Discharge Date:  11/01/14               Expected Discharge Plan:  Home/Self Care  In-House Referral:  NA  Discharge planning Services  CM Consult  Post Acute Care Choice:  NA Choice offered to:  NA  DME Arranged:    DME Agency:     HH Arranged:    HH Agency:     Status of Service:  Completed, signed off  Medicare Important Message Given:    Date Medicare IM Given:    Medicare IM give by:    Date Additional Medicare IM Given:    Additional Medicare Important Message give by:     If discussed at Long Length of Stay Meetings, dates discussed:    Additional Comments:  Cheryl FlashBlackwell, Christophe Rising Crowder, RN 11/01/2014, 12:34 PM

## 2014-11-01 NOTE — Progress Notes (Signed)
[facility] PROGRESS NOTE  Donald Mcconnell [name]:[id number] DOB: 1928-09-23 DOA: 10/29/2014 PCP: Lorenda Peck, MD  Assessment/Plan: 1. Escherichia coli bacteremia- likely from the UTI, blood cultures report came back as Escherichia coli sensitive to cefazolin. Continue Rocephin at this time, WBC still 17,000. 2. UTI- urine culture also growing Escherichia coli, final sensitivities pending. Currently patient is on Rocephin. 3. AKI superimposed on C KD stage III- today creatinine is 2.6 to with BUN 41. Patient's baseline creatinine is 1.45 as of July 27, 2014. Continue with IV fluids. Will check BMP in a.m. 4. Hypothyroidism- continue Synthroid 5. Dementia- stable  Code Status: Full code Family Communication: *Discussed with wife at bedside Disposition Plan: Home when stable in next 24-48 hours   Consultants:  None  Procedures:  None  Antibiotics:  Ceftriaxone   HPI/Subjective: 81 year old man with history of UTI in the past, presented with vomiting, diarrhea, decreased oral intake and fever. He was admitted for acute kidney injury, UTI, dehydration.  Today feels better and wants to go home. Wife at bedside.  Objective: Filed Vitals:   11/01/14 0611  BP: 199/80  Pulse: 76  Temp: 99.3 F (37.4 C)  Resp: 18    Intake/Output Summary (Last 24 hours) at 11/01/14 1131 Last data filed at 11/01/14 1023  Gross per 24 hour  Intake   1860 ml  Output   1000 ml  Net    860 ml   Filed Weights   10/29/14 2005  Weight: 69.219 kg (152 lb 9.6 oz)    Exam:   General:  Appears in no acute distress  Cardiovascular: S1-S2 is regular  Respiratory: Clear to auscultation bilaterally  Abdomen: Soft, nontender, no organomegaly  Musculoskeletal: No edema of the lower extremities noted.   Data Reviewed: Basic Metabolic Panel:  Recent Labs Lab 10/29/14 1533 10/30/14 0632 10/31/14 0608  NA 142 142 141  K 4.6 4.0 3.9  CL 106 109 107  CO2 GLUCOSE  128* 106* 106*  BUN 44* 42* 41*  CREATININE 3.00* 2.56* 2.62*  CALCIUM 8.7* 8.1* 8.6*  MG 1.6*  --   --    Liver Function Tests:  Recent Labs Lab 10/29/14 1533  AST 30  ALT 15*  ALKPHOS 65  BILITOT 1.2  PROT 6.9  ALBUMIN 3.4*   No results for input(s): LIPASE, AMYLASE in the last 168 hours. No results for input(s): AMMONIA in the last 168 hours. CBC:  Recent Labs Lab 10/29/14 1533 10/30/14 0632 10/31/14 0608  WBC 24.8* 16.1* 17.7*  NEUTROABS 22.5*  --   --   HGB 15.3 12.6* 12.6*  HCT 44.2 38.1* 37.8*  MCV 92.9 94.1 94.0  PLT 156 158 157   Cardiac Enzymes:  Recent Labs Lab 10/29/14 1533  TROPONINI 0.04*   CBG: No results for input(s): GLUCAP in the last 168 hours.  Recent Results (from the past 240 hour(s))  Blood culture (routine x 2)     Status: None   Collection Time: 10/29/14  4:07 PM  Result Value Ref Range Status   Specimen Description LEFT ANTECUBITAL  Final   Special Requests   Final    BOTTLES DRAWN AEROBIC AND ANAEROBIC AER 16CC ANA 12CC   Culture  Setup Time   Final    GRAM NEGATIVE RODS Gram Stain Report Called to,Read Back By and Verified With: KNIGHT, C AT 0847 ON 10/30/2014 BY WOODS, M   Culture   Final    ESCHERICHIA COLI Note: SUSCEPTIBILITIES PERFORMED ON  PREVIOUS CULTURE WITHIN THE LAST 5 DAYS. Note: Gram Stain Report Called to,Read Back By and Verified With: KNIGHT C AT 0847 ON [id number] BY WOODS M. Performed at [facility]    Report Status 11/01/2014 FINAL  Final  Blood culture (routine x 2)     Status: None   Collection Time: 10/29/14  4:13 PM  Result Value Ref Range Status   Specimen Description BLOOD LEFT HAND  Final   Special Requests   Final    BOTTLES DRAWN AEROBIC AND ANAEROBIC AER 8CC ANA 8CC   Culture  Setup Time   Final    GRAM NEGATIVE RODS Gram Stain Report Called to,Read Back By and Verified With: KNIGHT, C AT 0949 ON 10/30/2014 BY WOODS, M    Culture   Final    ESCHERICHIA  COLI Note: Gram Stain Report Called to,Read Back By and Verified With: KNIGHT C. AT 0949 BY WOODS M. Performed at [facility] Performed at [facility]    Report Status 11/01/2014 FINAL  Final   Organism ID, Bacteria ESCHERICHIA COLI  Final      Susceptibility   Escherichia coli - MIC*    AMPICILLIN <=2 SENSITIVE Sensitive     AMPICILLIN/SULBACTAM <=2 SENSITIVE Sensitive     CEFAZOLIN <=4 SENSITIVE Sensitive     CEFEPIME <=1 SENSITIVE Sensitive     CEFTAZIDIME <=1 SENSITIVE Sensitive     CEFTRIAXONE <=1 SENSITIVE Sensitive     CIPROFLOXACIN <=0.25 SENSITIVE Sensitive     GENTAMICIN <=1 SENSITIVE Sensitive     IMIPENEM <=0.25 SENSITIVE Sensitive     PIP/TAZO <=4 SENSITIVE Sensitive     TOBRAMYCIN <=1 SENSITIVE Sensitive     TRIMETH/SULFA <=20 SENSITIVE Sensitive     * ESCHERICHIA COLI  Urine culture     Status: None (Preliminary result)   Collection Time: 10/29/14  4:30 PM  Result Value Ref Range Status   Specimen Description URINE, CLEAN CATCH  Final   Special Requests NONE  Final   Colony Count   Final    >=100,000 COLONIES/ML [facility] at [facility]    Culture   Final    ESCHERICHIA COLI [facility]formed at [facility]    Report Status PENDING  Incomplete     Studies: No results found.  Scheduled Meds: . allopurinol  300 mg Oral Daily  . aspirin EC  81 mg Oral Daily  . cefTRIAXone (ROCEPHIN)  IV  1 g Intravenous Q24H  . levothyroxine  100 mcg Oral QAC breakfast  . nystatin  5 mL Oral QID  . sodium chloride  3 mL Intravenous Q12H   Continuous Infusions: . sodium chloride 75 mL/hr at 10/30/14 [id number]    Principal Problem:   Bacteremia Active Problems:   UTI (lower urinary tract infection)   Acute renal failure superimposed on stage 3 chronic kidney disease   AKI (acute kidney injury)   Sepsis    Time spent: 25 min    Edward PlainfieldAMA,Jourdan Durbin S  [facility] Pager [phone]. If 7PM-7AM, please contact night-coverage at [email or web address],  password [email or web address] 11/01/2014, 11:31 AM  LOS: 3 days

## 2014-11-01 NOTE — Progress Notes (Signed)
Rocephin dose changed to 2gm IV daily for bacteremia per P&T policy. Recommend change to Keflex 500mg  po BID to complete 2 week antibiotic course when appropriate for discharge (through Jun 11th). [facility]  Junita PushMichelle Ambree Frances, PharmD, BCPS 11/01/2014@1 :34 PM

## 2014-11-02 DIAGNOSIS — A4151 Sepsis due to Escherichia coli [E. coli]: Secondary | ICD-10-CM

## 2014-11-02 DIAGNOSIS — N183 Chronic kidney disease, stage 3 (moderate): Secondary | ICD-10-CM

## 2014-11-02 DIAGNOSIS — E860 Dehydration: Secondary | ICD-10-CM

## 2014-11-02 DIAGNOSIS — R7881 Bacteremia: Secondary | ICD-10-CM

## 2014-11-02 DIAGNOSIS — N179 Acute kidney failure, unspecified: Secondary | ICD-10-CM

## 2014-11-02 DIAGNOSIS — N390 Urinary tract infection, site not specified: Secondary | ICD-10-CM

## 2014-11-02 LAB — BASIC METABOLIC PANEL
Anion gap: 8 (ref 5–15)
BUN: 29 mg/dL — ABNORMAL HIGH (ref 6–20)
CALCIUM: 8 mg/dL — AB (ref 8.9–10.3)
CO2: 24 mmol/L (ref 22–32)
CREATININE: 2.09 mg/dL — AB (ref 0.61–1.24)
Chloride: 108 mmol/L (ref 101–111)
GFR, EST AFRICAN AMERICAN: 32 mL/min — AB (ref >60)
GFR, EST NON AFRICAN AMERICAN: 27 mL/min — AB (ref >60)
Glucose, Bld: 92 mg/dL (ref 65–99)
POTASSIUM: 3.2 mmol/L — AB (ref 3.5–5.1)
SODIUM: 140 mmol/L (ref 135–145)

## 2014-11-02 LAB — CBC
HCT: 31.9 % — ABNORMAL LOW (ref 39.0–52.0)
HEMOGLOBIN: 11 g/dL — AB (ref 13.0–17.0)
MCH: 31.7 pg (ref 26.0–34.0)
MCHC: 34.5 g/dL (ref 30.0–36.0)
MCV: 91.9 fL (ref 78.0–100.0)
Platelets: 163 10*3/uL (ref 150–400)
RBC: 3.47 MIL/uL — AB (ref 4.22–5.81)
RDW: 15.2 % (ref 11.5–15.5)
WBC: 10.2 10*3/uL (ref 4.0–10.5)

## 2014-11-02 MED ORDER — CEPHALEXIN 500 MG PO CAPS: 500.0000 mg | ORAL_CAPSULE | Freq: Two times a day (BID) | ORAL | Status: AC

## 2014-11-02 MED ORDER — METOPROLOL TARTRATE 25 MG PO TABS: 25.0000 mg | ORAL_TABLET | Freq: Two times a day (BID) | ORAL | Status: AC

## 2014-11-02 MED ORDER — HYDRALAZINE HCL 25 MG PO TABS: 25.0000 mg | ORAL_TABLET | Freq: Three times a day (TID) | ORAL | Status: AC

## 2014-11-02 NOTE — Care Management Note (Signed)
Case Management Note  Patient Details  Name: Donald Mcconnell MRN: [id number] Date of Birth: 1929/05/08  Subjective/Objective:                    Action/Plan:   Expected Discharge Date:  11/01/14               Expected Discharge Plan:  Home/Self Care  In-House Referral:  NA  Discharge planning Services  CM Consult  Post Acute Care Choice:  NA Choice offered to:  NA  DME Arranged:    DME Agency:     HH Arranged:    HH Agency:     Status of Service:  Completed, signed off  Medicare Important Message Given:  Yes Date Medicare IM Given:  11/02/14 Medicare IM give by:  Arlyss Queenammy Afra Tricarico, RN BSN CM Date Additional Medicare IM Given:    Additional Medicare Important Message give by:     If discussed at Long Length of Stay Meetings, dates discussed:    Additional Comments: Repeating lab work today. If ok anticipate discharge today. Arlyss QueenBlackwell, Justo Hengel Talentrowder, RN 11/02/2014, 3:40 PM

## 2014-11-02 NOTE — Progress Notes (Signed)
Discharge instructions and prescriptions given to wife, verbalized understanding, out in stable condition via w/c with staff.

## 2014-11-02 NOTE — Discharge Summary (Addendum)
Physician Discharge Summary  Donald Mcconnell ZOX:[id number] DOB: [birth year] DOA: 10/29/2014  PCP: Lorenda Peck, MD  Admit date: 10/29/2014 Discharge date: 11/02/2014  Time spent: 40 minutes  Recommendations for Outpatient Follow-up:  1. Follow-up with primary care physician in 1-2 weeks  Discharge Diagnoses:  Principal Problem:   Escherichia coli bacteremia and sepsis, present on admission Active Problems:   UTI (lower urinary tract infection)   Acute renal failure superimposed on stage 3 chronic kidney disease   AKI (acute kidney injury)   Sepsis  Dementia with behavioral disturbances Chronic diastolic congestive heart failure  Discharge Condition: improved  Diet recommendation: low salt  Filed Weights   10/29/14 2005  Weight: 69.219 kg (152 lb 9.6 oz)    History of present illness:  This patient was admitted to the hospital with fever, decreased appetite. He began having vomiting and diarrhea which is followed by fever and decreased appetite. He was found have a urinary tract infection and dehydration the emergency room. He also had acute renal failure superimposed on chronic kidney disease stage III. He was admitted for further treatment.  Hospital Course:  Patient was started on intravenous Rocephin. Blood cultures were positive for Escherichia coli. Once on antibiotic, his fevers resolve. Renal failure improved with intravenous hydration and creatinine is trending down towards his baseline. Blood pressure appears to be stable at this time. Patient has baseline dementia and developed agitation and his hospital stay, likely related to change in his regular apartment. He's been transitioned to oral Keflex to complete his antibiotic course. Since renal function is approaching baseline and clinically, his family appears that he is back to his baseline level of functioning, he'll be discharged home to complete his treatment as an  outpatient.  Procedures:    Consultations:    Discharge Exam: Filed Vitals:   11/02/14 1504  BP: 173/81  Pulse: 77  Temp: 97.7 F (36.5 C)  Resp: 20    General: NAD Cardiovascular: S1, S2 RRR Respiratory: CTA B  Discharge Instructions   Discharge Instructions    Diet - low sodium heart healthy    Complete by:  As directed      Increase activity slowly    Complete by:  As directed           Discharge Medication List as of 11/02/2014  4:59 PM    START taking these medications   Details  cephALEXin (KEFLEX) 500 MG capsule Take 1 capsule (500 mg total) by mouth 2 (two) times daily., Starting 11/02/2014, Until Discontinued, Print    hydrALAZINE (APRESOLINE) 25 MG tablet Take 1 tablet (25 mg total) by mouth 3 (three) times daily., Starting 11/02/2014, Until Discontinued, Print    metoprolol tartrate (LOPRESSOR) 25 MG tablet Take 1 tablet (25 mg total) by mouth 2 (two) times daily., Starting 11/02/2014, Until Discontinued, Normal      CONTINUE these medications which have NOT CHANGED   Details  allopurinol (ZYLOPRIM) 300 MG tablet Take 300 mg by mouth daily., Until Discontinued, Historical Med    ALPRAZolam (XANAX) 1 MG tablet Take 1 mg by mouth as needed for anxiety., Until Discontinued, Historical Med    aspirin EC 81 MG EC tablet Take 1 tablet (81 mg total) by mouth daily., Starting 02/16/2013, Until Discontinued, OTC    levothyroxine (SYNTHROID, LEVOTHROID) 100 MCG tablet Take 100 mcg by mouth daily before breakfast., Until Discontinued, Historical Med    traMADol (ULTRAM) 50 MG tablet Take 50 mg by mouth every 6 (six) hours as  needed for moderate pain., Until Discontinued, Historical Med       No Known Allergies Follow-up Information    Follow up with ROBERTS, Vernie Ammons, MD. Schedule an appointment as soon as possible for a visit on 11/15/2014.   Specialty:  Internal Medicine   Why:  Appointment Tues. June 14th at 3:45pm   Contact information:   [address] [place] [address] [phone]        The results of significant diagnostics from this hospitalization (including imaging, microbiology, ancillary and laboratory) are listed below for reference.    Significant Diagnostic Studies: Dg Chest Port 1 View  10/29/2014   CLINICAL DATA:  Nausea/vomiting/diarrhea, fever  EXAM: PORTABLE CHEST - 1 VIEW  COMPARISON:  07/25/2014  FINDINGS: Mild patchy bilateral lower lobe opacities, atelectasis versus pneumonia. No pleural effusion or pneumothorax.  The heart is normal in size.  IMPRESSION: Mild patchy bilateral lower lobe opacities, atelectasis versus pneumonia.   Electronically Signed   By: Charline Bills M.D.   On: 10/29/2014 17:16    Microbiology: Recent Results (from the past 240 hour(s))  Blood culture (routine x 2)     Status: None   Collection Time: 10/29/14  4:07 PM  Result Value Ref Range Status   Specimen Description LEFT ANTECUBITAL  Final   Special Requests   Final    BOTTLES DRAWN AEROBIC AND ANAEROBIC AER 16CC ANA 12CC   Culture  Setup Time   Final    GRAM NEGATIVE RODS Gram Stain Report Called to,Read Back By and Verified With: KNIGHT, C AT 0847 ON 10/30/2014 BY WOODS, M   Culture   Final    ESCHERICHIA COLI Note: SUSCEPTIBILITIES PERFORMED ON PREVIOUS CULTURE WITHIN THE LAST 5 DAYS. Note: Gram Stain Report Called to,Read Back By and Verified With: KNIGHT C AT 0847 ON [id number] BY WOODS M. Performed at [facility] Performed at [facility]    Report Status 11/01/2014 FINAL  Final  Blood culture (routine x 2)     Status: None   Collection Time: 10/29/14  4:13 PM  Result Value Ref Range Status   Specimen Description BLOOD LEFT HAND  Final   Special Requests   Final    BOTTLES DRAWN AEROBIC AND ANAEROBIC AER 8CC ANA 8CC   Culture  Setup Time   Final    GRAM NEGATIVE RODS Gram Stain Report Called to,Read Back By and Verified With: KNIGHT, C AT 0949 ON 10/30/2014 BY WOODS, M    Culture   Final     ESCHERICHIA COLI Note: Gram Stain Report Called to,Read Back By and Verified With: KNIGHT C. AT 0949 BY WOODS M. Performed at [facility] Performed at [facility]    Report Status 11/01/2014 FINAL  Final   Organism ID, Bacteria ESCHERICHIA COLI  Final      Susceptibility   Escherichia coli - MIC*    AMPICILLIN <=2 SENSITIVE Sensitive     AMPICILLIN/SULBACTAM <=2 SENSITIVE Sensitive     CEFAZOLIN <=4 SENSITIVE Sensitive     CEFEPIME <=1 SENSITIVE Sensitive     CEFTAZIDIME <=1 SENSITIVE Sensitive     CEFTRIAXONE <=1 SENSITIVE Sensitive     CIPROFLOXACIN <=0.25 SENSITIVE Sensitive     GENTAMICIN <=1 SENSITIVE Sensitive     IMIPENEM <=0.25 SENSITIVE Sensitive     PIP/TAZO <=4 SENSITIVE Sensitive     TOBRAMYCIN <=1 SENSITIVE Sensitive     TRIMETH/SULFA <=20 SENSITIVE Sensitive     * ESCHERICHIA COLI  Urine culture     Status: None   Collection Time: 10/29/14  4:30 PM  Result Value Ref Range Status   Specimen Description URINE, CLEAN CATCH  Final   Special Requests NONE  Final   Colony Count   Final    >=100,000 COLONIES/ML Performed at [facility]    Culture   Final    ESCHERICHIA COLI Performed at [facility]    Report Status 11/01/2014 FINAL  Final   Organism ID, Bacteria ESCHERICHIA COLI  Final      Susceptibility   Escherichia coli - MIC*    AMPICILLIN <=2 SENSITIVE Sensitive     CEFAZOLIN <=4 SENSITIVE Sensitive     CEFTRIAXONE <=1 SENSITIVE Sensitive     CIPROFLOXACIN <=0.25 SENSITIVE Sensitive     GENTAMICIN <=1 SENSITIVE Sensitive     LEVOFLOXACIN <=0.12 SENSITIVE Sensitive     NITROFURANTOIN <=16 SENSITIVE Sensitive     TOBRAMYCIN <=1 SENSITIVE Sensitive     TRIMETH/SULFA <=20 SENSITIVE Sensitive     PIP/TAZO <=4 SENSITIVE Sensitive     * ESCHERICHIA COLI     Labs: Basic Metabolic Panel:  Recent Labs Lab 10/29/14 1533 10/30/14 0632 10/31/14 0608 11/02/14 1533  NA 142 142 141 140  K 4.6 4.0 3.9 3.2*  CL 106 109 107  108  CO2 24 23 24 24   GLUCOSE 128* 106* 106* 92  BUN 44* 42* 41* 29*  CREATININE 3.00* 2.56* 2.62* 2.09*  CALCIUM 8.7* 8.1* 8.6* 8.0*  MG 1.6*  --   --   --    Liver Function Tests:  Recent Labs Lab 10/29/14 1533  AST 30  ALT 15*  ALKPHOS 65  BILITOT 1.2  PROT 6.9  ALBUMIN 3.4*   No results for input(s): LIPASE, AMYLASE in the last 168 hours. No results for input(s): AMMONIA in the last 168 hours. CBC:  Recent Labs Lab 10/29/14 1533 10/30/14 0632 10/31/14 0608 11/02/14 1533  WBC 24.8* 16.1* 17.7* 10.2  NEUTROABS 22.5*  --   --   --   HGB 15.3 12.6* 12.6* 11.0*  HCT 44.2 38.1* 37.8* 31.9*  MCV 92.9 94.1 94.0 91.9  PLT 156 158 157 163   Cardiac Enzymes:  Recent Labs Lab 10/29/14 1533  TROPONINI 0.04*   BNP: BNP (last 3 results) No results for input(s): BNP in the last 8760 hours.  ProBNP (last 3 results) No results for input(s): PROBNP in the last 8760 hours.  CBG: No results for input(s): GLUCAP in the last 168 hours.     Signed:  Sujata Maines  [facility] 11/02/2014, 7:41 PM

## 2015-04-19 ENCOUNTER — Ambulatory Visit
Admission: RE | Admit: 2015-04-19 | Discharge: 2015-04-19 | Disposition: A | Discharge status: Home / Self Care | Payer: Commercial Managed Care - HMO | Source: Ambulatory Visit | Attending: Internal Medicine | Admitting: Internal Medicine

## 2015-04-19 ENCOUNTER — Other Ambulatory Visit: Payer: Self-pay | Admitting: Internal Medicine

## 2015-04-19 DIAGNOSIS — R0602 Shortness of breath: Secondary | ICD-10-CM

## 2015-08-12 IMAGING — DX DG CHEST 2V
2 series · 2 of 2 positions shown · non-contrast
Comparison: 02/13/2013 and earlier.

CLINICAL DATA: 85-year-old male with abnormal EKG. Cough fever and
malaise. Initial encounter.

EXAM:
CHEST  2 VIEW

[chest pa]
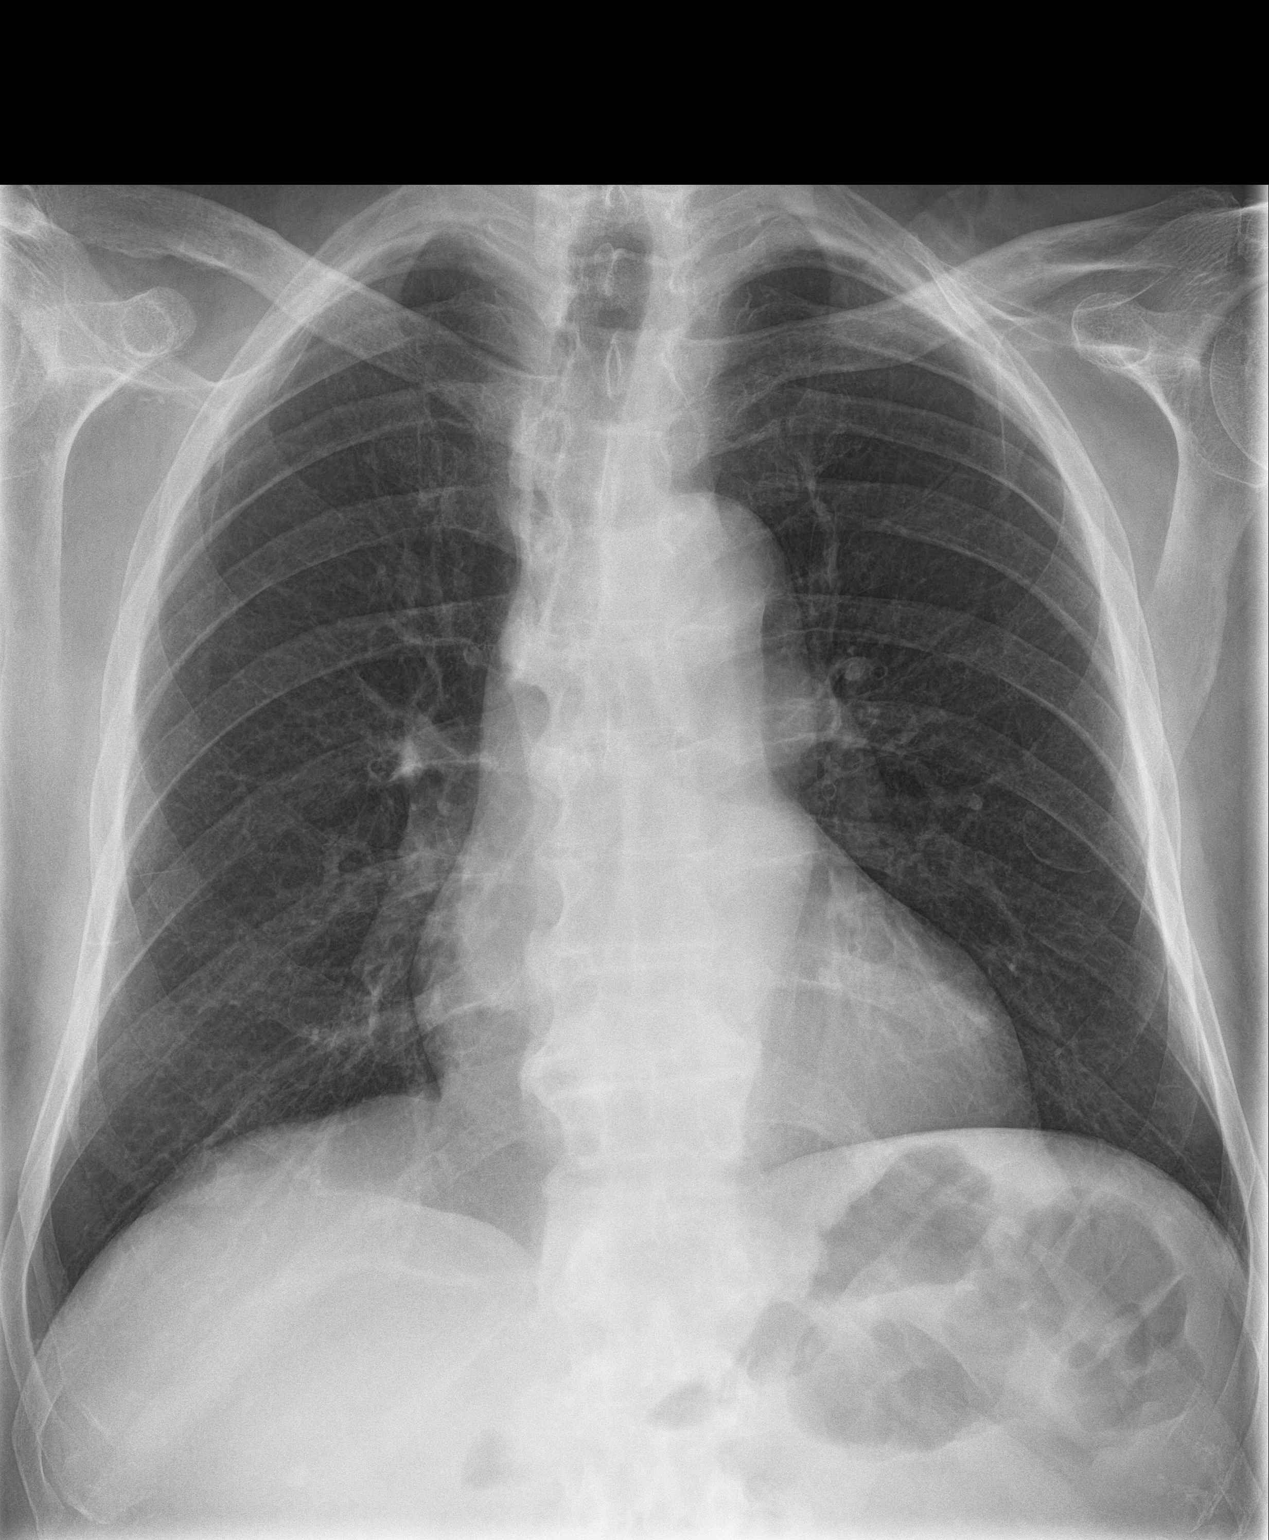

[chest lat]
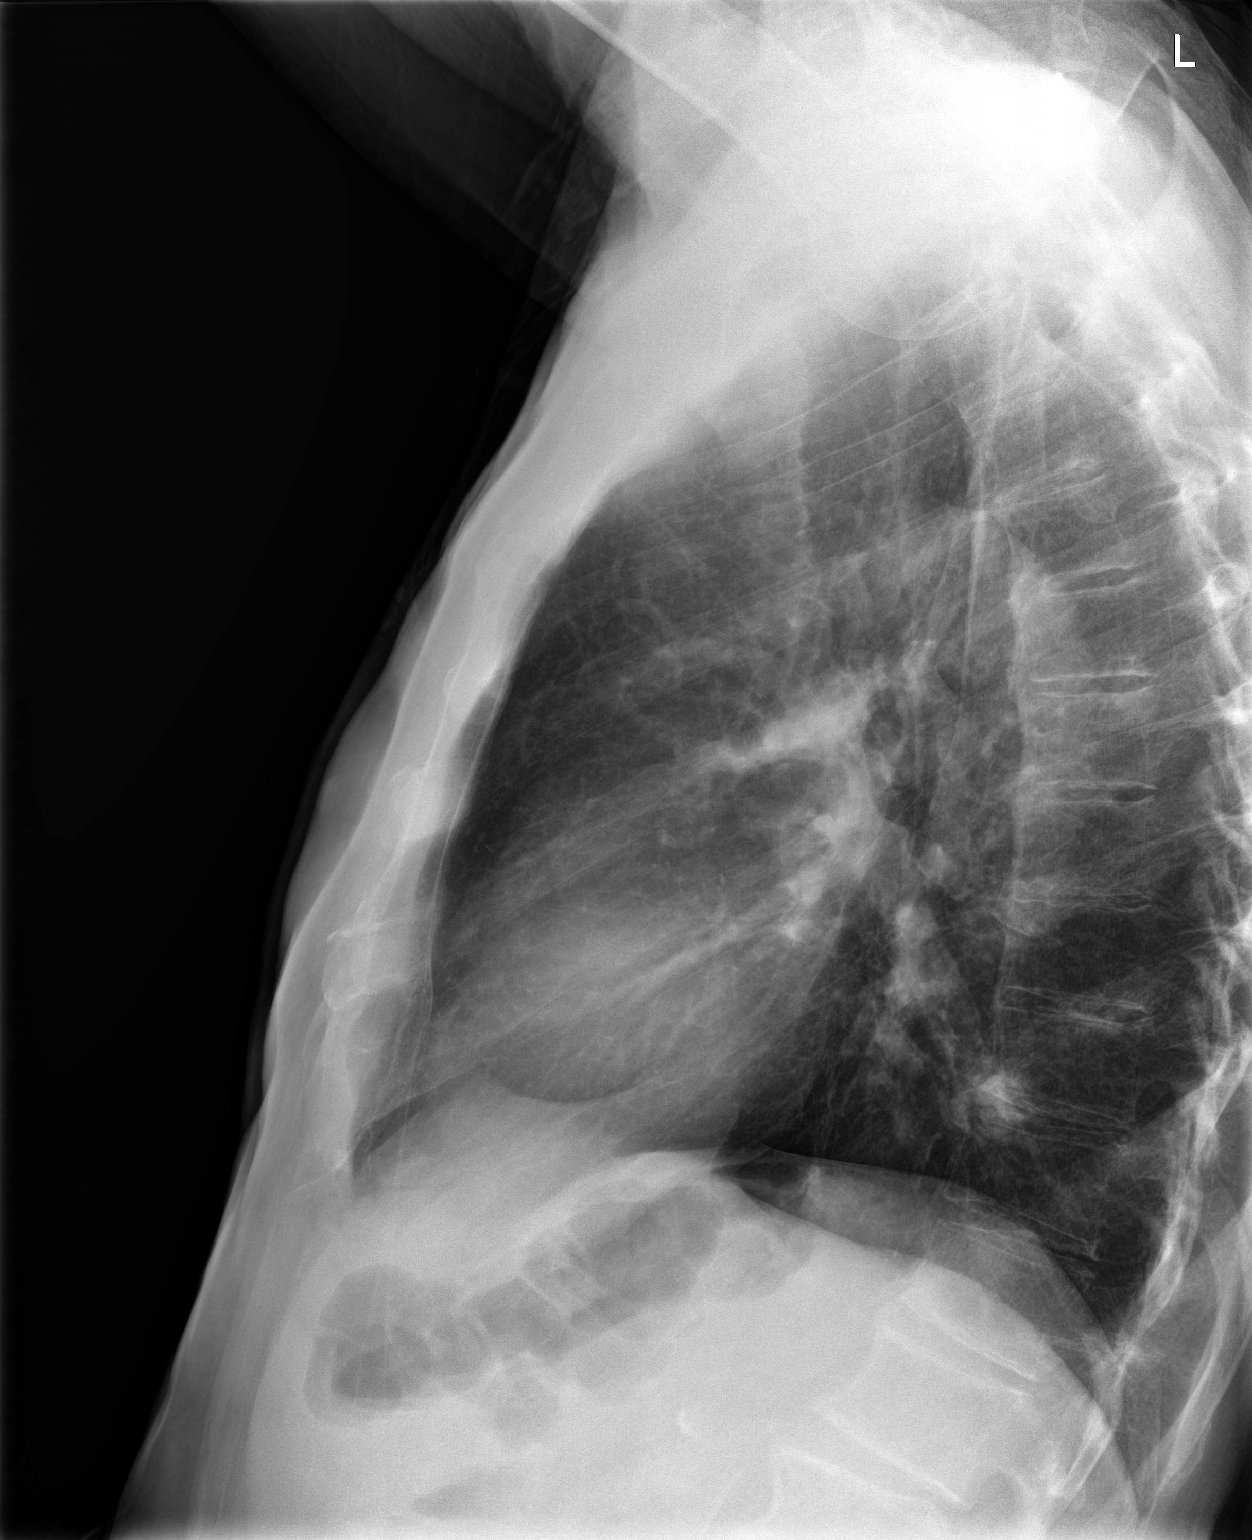

[2 of 2 positions shown; findings below may reference images not displayed]

FINDINGS: Larger lung volumes today. Cardiac size within normal limits. Stable
mild tortuosity of the thoracic aorta. Other mediastinal contours
are within normal limits. Visualized tracheal air column is within
normal limits. No pneumothorax or pulmonary edema. No pleural
effusion or consolidation. Calcified atherosclerosis of the aorta.
No confluent pulmonary opacity. No acute osseous abnormality
identified.
IMPRESSION: No acute cardiopulmonary abnormality.

## 2016-05-06 IMAGING — CR DG CHEST 2V
2 series · 2 of 2 positions shown · non-contrast
Comparison: 10/29/2014

CLINICAL DATA: Shortness of Breath

EXAM:
CHEST - 2 VIEW

[view not recorded (1 of 2)]
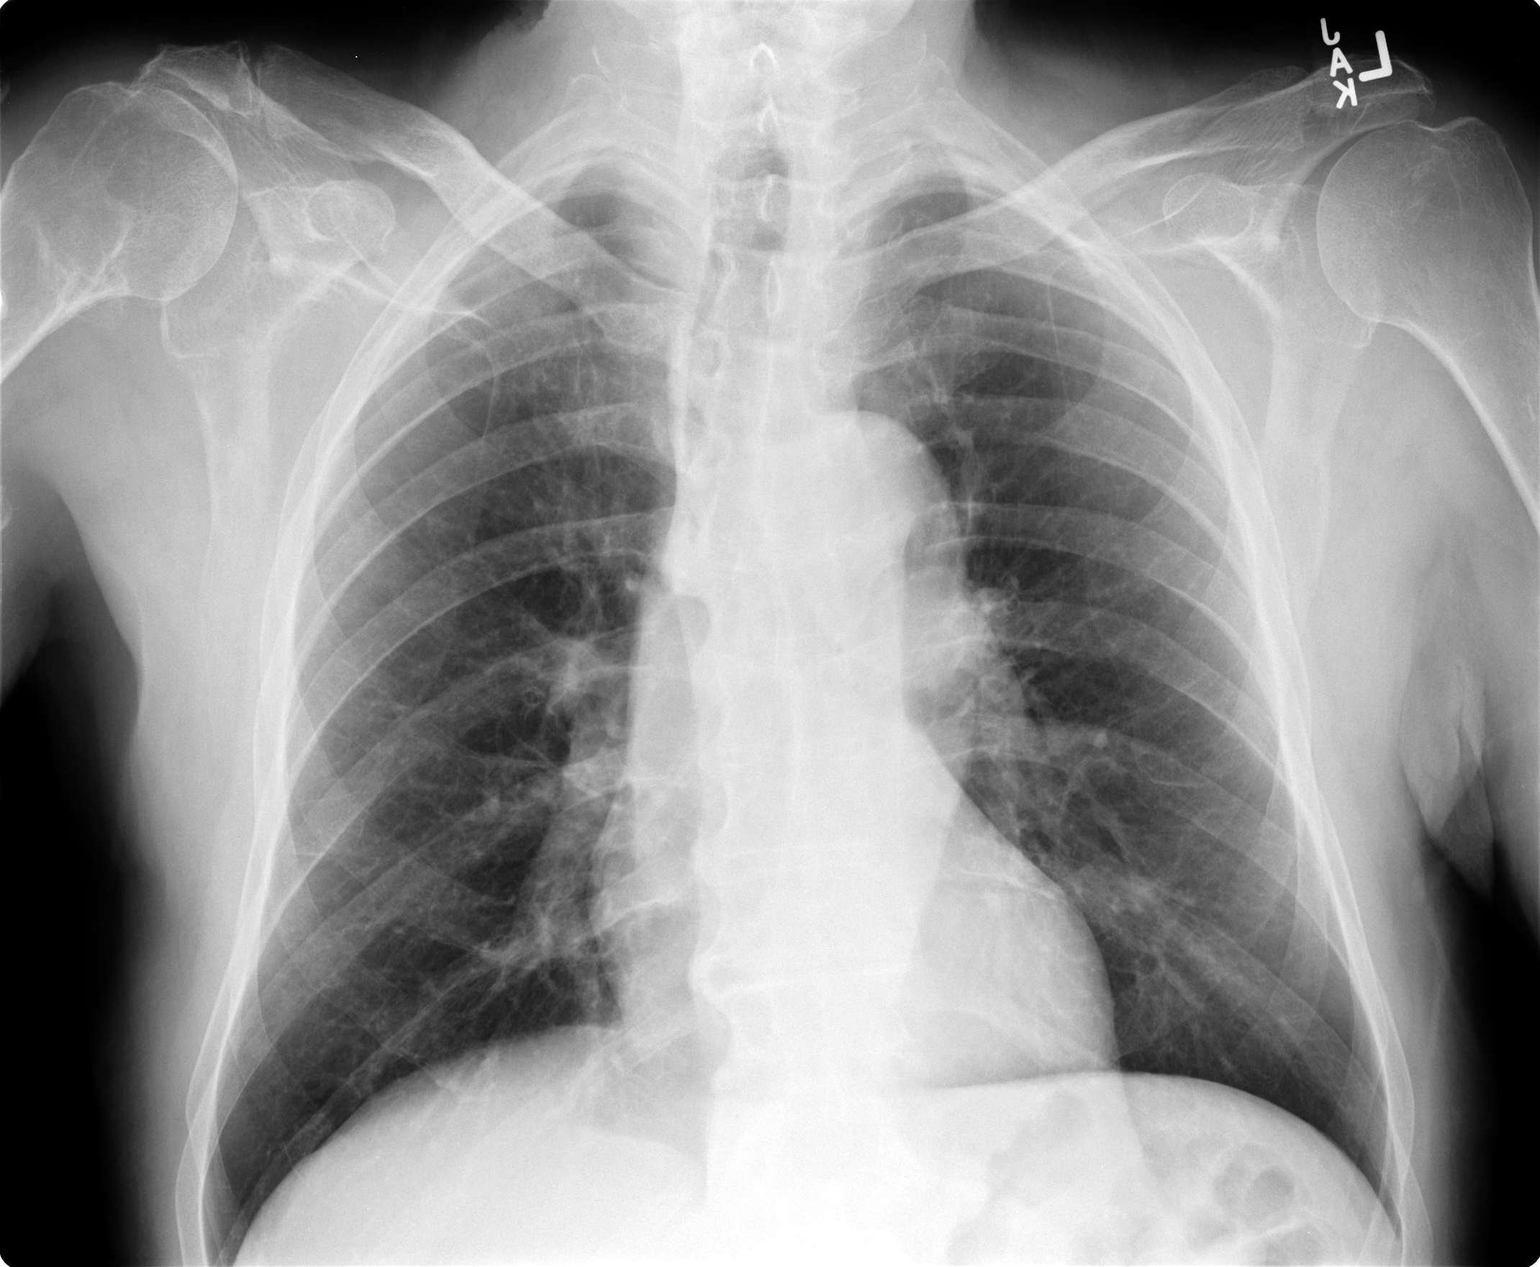

[view not recorded (2 of 2)]
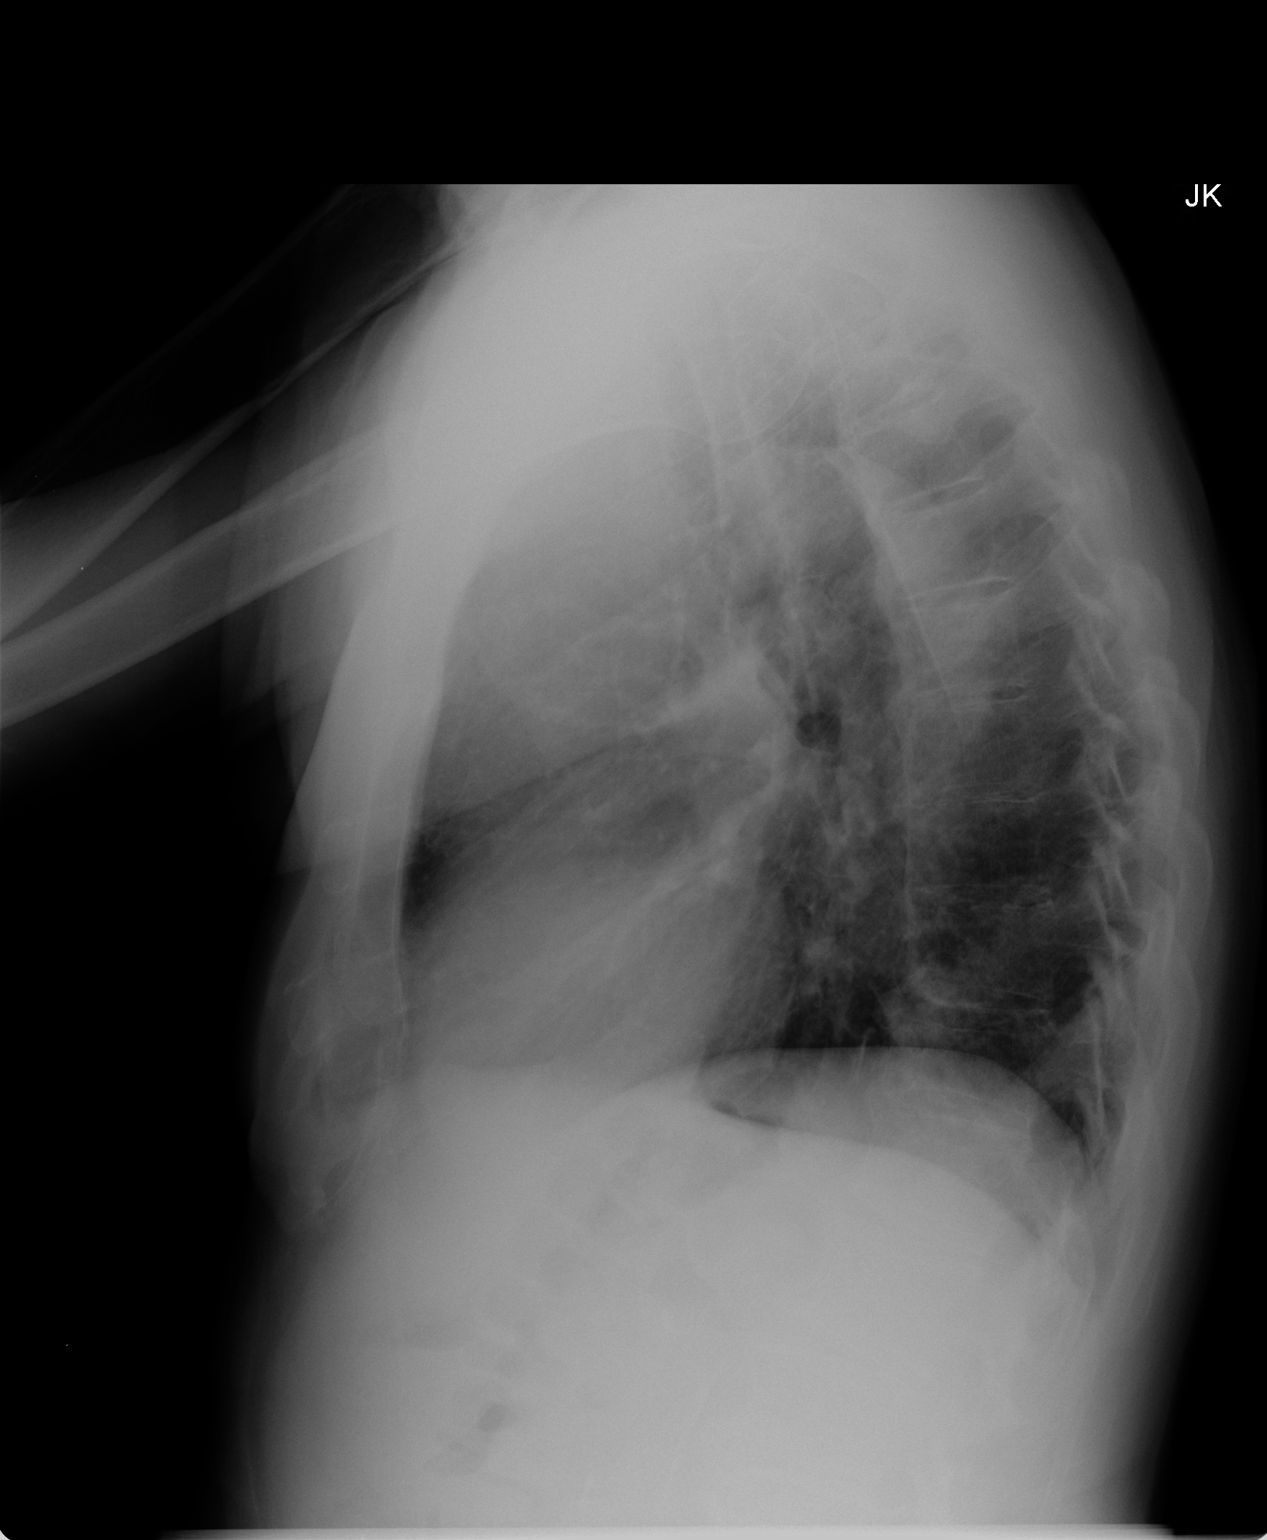

[2 of 2 positions shown; findings below may reference images not displayed]

FINDINGS: The heart size and mediastinal contours are within normal limits.
Both lungs are clear. The visualized skeletal structures are
unremarkable.
IMPRESSION: No active disease.

## 2016-11-21 ENCOUNTER — Encounter (HOSPITAL_COMMUNITY): Payer: Self-pay | Admitting: Cardiology

## 2016-11-21 ENCOUNTER — Emergency Department (HOSPITAL_COMMUNITY)
Admission: EM | Admit: 2016-11-21 | Discharge: 2016-11-21 | Disposition: A | Discharge status: Home / Self Care | Payer: Medicare HMO | Attending: Emergency Medicine | Admitting: Emergency Medicine

## 2016-11-21 DIAGNOSIS — R531 Weakness: Secondary | ICD-10-CM | POA: Diagnosis present

## 2016-11-21 DIAGNOSIS — E039 Hypothyroidism, unspecified: Secondary | ICD-10-CM | POA: Diagnosis not present

## 2016-11-21 DIAGNOSIS — I120 Hypertensive chronic kidney disease with stage 5 chronic kidney disease or end stage renal disease: Secondary | ICD-10-CM | POA: Diagnosis not present

## 2016-11-21 DIAGNOSIS — Z7982 Long term (current) use of aspirin: Secondary | ICD-10-CM | POA: Insufficient documentation

## 2016-11-21 DIAGNOSIS — R008 Other abnormalities of heart beat: Secondary | ICD-10-CM | POA: Diagnosis not present

## 2016-11-21 DIAGNOSIS — I498 Other specified cardiac arrhythmias: Secondary | ICD-10-CM

## 2016-11-21 DIAGNOSIS — I499 Cardiac arrhythmia, unspecified: Secondary | ICD-10-CM

## 2016-11-21 DIAGNOSIS — N186 End stage renal disease: Secondary | ICD-10-CM | POA: Diagnosis not present

## 2016-11-21 DIAGNOSIS — Z79899 Other long term (current) drug therapy: Secondary | ICD-10-CM | POA: Insufficient documentation

## 2016-11-21 HISTORY — DX: Cerebral infarction, unspecified: I63.9

## 2016-11-21 LAB — URINALYSIS, ROUTINE W REFLEX MICROSCOPIC
BILIRUBIN URINE: NEGATIVE
Glucose, UA: NEGATIVE mg/dL
Hgb urine dipstick: NEGATIVE
Ketones, ur: NEGATIVE mg/dL
Leukocytes, UA: NEGATIVE
NITRITE: NEGATIVE
Protein, ur: NEGATIVE mg/dL
SPECIFIC GRAVITY, URINE: 1.003 — AB (ref 1.005–1.030)
pH: 7 (ref 5.0–8.0)

## 2016-11-21 LAB — COMPREHENSIVE METABOLIC PANEL
ALT: 11 U/L — ABNORMAL LOW (ref 17–63)
ANION GAP: 8 (ref 5–15)
AST: 16 U/L (ref 15–41)
Albumin: 3.8 g/dL (ref 3.5–5.0)
Alkaline Phosphatase: 62 U/L (ref 38–126)
BUN: 20 mg/dL (ref 6–20)
CALCIUM: 8.8 mg/dL — AB (ref 8.9–10.3)
CHLORIDE: 106 mmol/L (ref 101–111)
CO2: 25 mmol/L (ref 22–32)
Creatinine, Ser: 1.85 mg/dL — ABNORMAL HIGH (ref 0.61–1.24)
GFR calc non Af Amer: 31 mL/min — ABNORMAL LOW (ref >60)
GFR, EST AFRICAN AMERICAN: 36 mL/min — AB (ref >60)
Glucose, Bld: 111 mg/dL — ABNORMAL HIGH (ref 65–99)
Potassium: 4.2 mmol/L (ref 3.5–5.1)
Sodium: 139 mmol/L (ref 135–145)
Total Bilirubin: 0.9 mg/dL (ref 0.3–1.2)
Total Protein: 6.4 g/dL — ABNORMAL LOW (ref 6.5–8.1)

## 2016-11-21 LAB — CBC WITH DIFFERENTIAL/PLATELET
Basophils Absolute: 0 10*3/uL (ref 0.0–0.1)
Basophils Relative: 1 %
EOS ABS: 0.1 10*3/uL (ref 0.0–0.7)
EOS PCT: 2 %
HCT: 40.4 % (ref 39.0–52.0)
Hemoglobin: 13.6 g/dL (ref 13.0–17.0)
LYMPHS ABS: 3.1 10*3/uL (ref 0.7–4.0)
Lymphocytes Relative: 39 %
MCH: 32.2 pg (ref 26.0–34.0)
MCHC: 33.7 g/dL (ref 30.0–36.0)
MCV: 95.5 fL (ref 78.0–100.0)
MONO ABS: 1 10*3/uL (ref 0.1–1.0)
MONOS PCT: 12 %
Neutro Abs: 3.8 10*3/uL (ref 1.7–7.7)
Neutrophils Relative %: 46 %
PLATELETS: 209 10*3/uL (ref 150–400)
RBC: 4.23 MIL/uL (ref 4.22–5.81)
RDW: 14.4 % (ref 11.5–15.5)
WBC: 8.1 10*3/uL (ref 4.0–10.5)

## 2016-11-21 LAB — TROPONIN I

## 2016-11-21 NOTE — ED Provider Notes (Signed)
[facility]-EMERGENCY DEPT Provider Note   CSN: [id number] Arrival date & time: 11/21/16  1459     History   Chief Complaint Chief Complaint  Patient presents with  . Bradycardia    HPI Donald Mcconnell is a 83 y.o. male.  Patient was outside for a few hours today. He wouldn't come inside. His granddaughter finally got him inside and she said he had one of his spells when he was acting out. Patient has a history of dementia. His blood pressure was moderately elevated and they decided bring him to the hospital    Weakness  Primary symptoms include no focal weakness. This is a chronic problem. The current episode started less than 1 hour ago. The problem has been resolved. There was no focality noted. There has been no fever. There were no medications administered prior to arrival. Associated medical issues do not include trauma.    Past Medical History:  Diagnosis Date  . Dementia   . Gout   . Hypertension   . Hypothyroidism   . Stroke (HCC)   . Thyroid disease    hypothyroidism  . UTI (lower urinary tract infection)     Patient Active Problem List   Diagnosis Date Noted  . AKI (acute kidney injury) (HCC) 10/31/2014  . Sepsis (HCC) 10/31/2014  . Bacteremia 10/30/2014  . UTI (lower urinary tract infection) 10/29/2014  . Acute renal failure superimposed on stage 3 chronic kidney disease (HCC) 10/29/2014  . Protein-calorie malnutrition, severe (HCC) 07/26/2014  . Physical deconditioning 07/25/2014  . Malnutrition (HCC) 07/25/2014  . Frequent PVCs   . Alzheimer's dementia 06/13/2014  . CVA (cerebral vascular accident) (HCC) 02/13/2013  . HTN (hypertension) 02/13/2013  . Renal failure 02/13/2013  . Hypothyroidism 02/13/2013  . History of gout 02/13/2013    Past Surgical History:  Procedure Laterality Date  . COLON SURGERY    . EYE SURGERY     cateract surgery  . SHOULDER SURGERY    . TEE WITHOUT CARDIOVERSION N/A 02/16/2013   Procedure: TRANSESOPHAGEAL ECHOCARDIOGRAM  (TEE);  Surgeon: Wendall Stade, MD;  Location: [facility] ENDOSCOPY;  Service: Cardiovascular;  Laterality: N/A;       Home Medications    Prior to Admission medications   Medication Sig Start Date End Date Taking? Authorizing Provider  allopurinol (ZYLOPRIM) 300 MG tablet Take 300 mg by mouth daily.    [provider]  ALPRAZolam Prudy Feeler) 1 MG tablet Take 1 mg by mouth as needed for anxiety.    [provider]  aspirin EC 81 MG EC tablet Take 1 tablet (81 mg total) by mouth daily. 02/16/13   Esperanza Sheets, MD  cephALEXin (KEFLEX) 500 MG capsule Take 1 capsule (500 mg total) by mouth 2 (two) times daily. 11/02/14   Erick Blinks, MD  hydrALAZINE (APRESOLINE) 25 MG tablet Take 1 tablet (25 mg total) by mouth 3 (three) times daily. 11/02/14   Erick Blinks, MD  levothyroxine (SYNTHROID, LEVOTHROID) 100 MCG tablet Take 100 mcg by mouth daily before breakfast.    [provider]  metoprolol tartrate (LOPRESSOR) 25 MG tablet Take 1 tablet (25 mg total) by mouth 2 (two) times daily. 11/02/14   Erick Blinks, MD  traMADol (ULTRAM) 50 MG tablet Take 50 mg by mouth every 6 (six) hours as needed for moderate pain.    [provider]    Family History Family History  Problem Relation Age of Onset  . Prostate cancer Brother     Social History Social  History  Substance Use Topics  . Smoking status: Never Smoker  . Smokeless tobacco: Not on file  . Alcohol use Yes     Comment: socially - none since 2010     Allergies   Patient has no known allergies.   Review of Systems Review of Systems  Unable to perform ROS: Dementia  Neurological: Positive for weakness. Negative for focal weakness.     Physical Exam Updated Vital Signs BP (!) 187/122   Pulse (!) 30   Resp 18   Ht 5\' 10"  (1.778 m)   Wt 68.9 kg (152 lb)   SpO2 100%   BMI 21.81 kg/m   Physical Exam  Constitutional: He appears well-developed.  HENT:  Head: Normocephalic.  Eyes:  Conjunctivae and EOM are normal. No scleral icterus.  Neck: Neck supple. No thyromegaly present.  Cardiovascular: Normal rate and regular rhythm.  Exam reveals no gallop and no friction rub.   No murmur heard. Pulmonary/Chest: No stridor. He has no wheezes. He has no rales. He exhibits no tenderness.  Abdominal: He exhibits no distension. There is no tenderness. There is no rebound.  Musculoskeletal: Normal range of motion. He exhibits no edema.  Lymphadenopathy:    He has no cervical adenopathy.  Neurological: He is alert. He exhibits normal muscle tone. Coordination normal.  Oriented to person only.  Skin: No rash noted. No erythema.  Psychiatric: He has a normal mood and affect. His behavior is normal.     ED Treatments / Results  Labs (all labs ordered are listed, but only abnormal results are displayed) Labs Reviewed  COMPREHENSIVE METABOLIC PANEL - Abnormal; Notable for the following:       Result Value   Glucose, Bld 111 (*)    Creatinine, Ser 1.85 (*)    Calcium 8.8 (*)    Total Protein 6.4 (*)    ALT 11 (*)    GFR calc non Af Amer 31 (*)    GFR calc Af Amer 36 (*)    All other components within normal limits  URINALYSIS, ROUTINE W REFLEX MICROSCOPIC - Abnormal; Notable for the following:    Color, Urine STRAW (*)    Specific Gravity, Urine 1.003 (*)    All other components within normal limits  CBC WITH DIFFERENTIAL/PLATELET  TROPONIN I    EKG  EKG Interpretation  Date/Time:  Thursday November 21 2016 15:28:48 EDT Ventricular Rate:  69 PR Interval:    QRS Duration: 100 QT Interval:  471 QTC Calculation: 408 R Axis:   44 Text Interpretation:  Sinus rhythm Ventricular bigeminy Anterior infarct, old Confirmed by Bethann BerkshireZammit, Delois Silvester [phone]([id number]) on 11/21/2016 4:45:45 PM       Radiology No results found.  Procedures Procedures (including critical care time)  Medications Ordered in ED Medications - No data to display   Initial Impression / Assessment and Plan / ED  Course  I have reviewed the triage vital signs and the nursing notes.  Pertinent labs & imaging results that were available during my care of the patient were reviewed by me and considered in my medical decision making (see chart for details).   patient is back to his normal self. I suspect patient had mild heat exhaustion he will follow-up with his PCP and continue taking his blood pressure medicines    Final Clinical Impressions(s) / ED Diagnoses   Final diagnoses:  Bigeminy    New Prescriptions New Prescriptions   No medications on file     Bethann BerkshireZammit, Alexandro Line, MD  11/21/16 1656  

## 2016-11-21 NOTE — ED Triage Notes (Signed)
Grand daughter checked blood pressure at home today and it was high.  Pt crying in triage and unable to talk to triage nurse.

## 2016-11-21 NOTE — Discharge Instructions (Signed)
Drink plenty of fluids.  Follow up with your family md for recheck next week

## 2016-11-21 NOTE — ED Notes (Signed)
Grand daughter in triage and states his heart rate was also in the 30's at home.

## 2018-03-25 ENCOUNTER — Telehealth (INDEPENDENT_AMBULATORY_CARE_PROVIDER_SITE_OTHER): Payer: Self-pay | Admitting: Family Medicine

## 2018-03-25 ENCOUNTER — Encounter (INDEPENDENT_AMBULATORY_CARE_PROVIDER_SITE_OTHER): Payer: Self-pay | Admitting: Family Medicine

## 2018-03-25 ENCOUNTER — Ambulatory Visit (INDEPENDENT_AMBULATORY_CARE_PROVIDER_SITE_OTHER): Payer: Medicare HMO | Admitting: Family Medicine

## 2018-03-25 ENCOUNTER — Other Ambulatory Visit (INDEPENDENT_AMBULATORY_CARE_PROVIDER_SITE_OTHER): Payer: Self-pay | Admitting: Family Medicine

## 2018-03-25 VITALS — BP 174/80 | HR 58 | Temp 98.0°F | Resp 28 | Wt 163.4 lb

## 2018-03-25 DIAGNOSIS — R059 Cough, unspecified: Secondary | ICD-10-CM

## 2018-03-25 DIAGNOSIS — R05 Cough: Secondary | ICD-10-CM | POA: Diagnosis not present

## 2018-03-25 DIAGNOSIS — J181 Lobar pneumonia, unspecified organism: Secondary | ICD-10-CM | POA: Diagnosis not present

## 2018-03-25 DIAGNOSIS — J189 Pneumonia, unspecified organism: Secondary | ICD-10-CM

## 2018-03-25 DIAGNOSIS — Q845 Enlarged and hypertrophic nails: Secondary | ICD-10-CM

## 2018-03-25 MED ORDER — AMOXICILLIN-POT CLAVULANATE 400-57 MG/5ML PO SUSR: 800.0000 mg | Freq: Two times a day (BID) | ORAL | 1 refills | Status: DC

## 2018-03-25 MED ORDER — METHYLPREDNISOLONE ACETATE 40 MG/ML IJ SUSP: 40.0000 mg | Freq: Once | INTRAMUSCULAR | Status: AC

## 2018-03-25 MED ORDER — AMOXICILLIN-POT CLAVULANATE 875-125 MG PO TABS: 1.0000 | ORAL_TABLET | Freq: Two times a day (BID) | ORAL | 1 refills | Status: AC

## 2018-03-25 NOTE — Progress Notes (Signed)
Office Visit Note   Patient: Donald Mcconnell           Date of Birth: 1929/02/02           MRN: [id number] Visit Date: 03/25/2018 Requested by: Donald Apley, MD [address] [place], [place] [address] PCP: Donald Apley, MD  Subjective: Chief Complaint  Patient presents with  . Cough  . runny nose, shortness of breath    HPI: He is here with a cough.  Symptoms started a month ago.  He feels short of breath, with some head congestion.  He feels cold all the time.  He has not taken anything for this.  He also has chronically thickened toenails in his feet and would like to have them cut at some point.               ROS: He has a history of hypertension, dementia, thyroid dysfunction and renal troubles.  Also a history of gout.  Objective: Vital Signs: BP (!) 174/80 (BP Location: Right Arm, Patient Position: Sitting, Cuff Size: Normal)   Pulse (!) 58   Temp 98 F (36.7 C)   Resp (!) 28   Wt 163 lb 6.4 oz (74.1 kg)   SpO2 99%   BMI 23.45 kg/m   Physical Exam:  HEENT:  [place]/AT, PERRLA, EOM Full, no nystagmus.  Funduscopic examination within normal limits.  No conjunctival erythema.  Tympanic membranes are pearly gray with normal landmarks.  External ear canals are normal.  Nasal passages are congested.  Oropharynx is clear.  No significant lymphadenopathy.  No thyromegaly or nodules.  2+ carotid pulses without bruits. LUNGS: Is for trach crackles in the right lung base.  No wheezing, otherwise good air movement. CV: Regular rate and rhythm without murmurs, rubs, or gallops. TOES: Chronically thickened hypertrophic toenails in both feet.   Imaging: None today.  Assessment & Plan: 1.  Cough with suspected right lung pneumonia -Steroid injection today, Augmentin.  Follow-up in 1 week for recheck and chest x-ray if not improving.  2.  Hypertrophic toenails -At next visit we will do nail trimming if desired.   Follow-Up Instructions: Return in about 1 week (around  04/01/2018).       Procedures: None today.   PMFS History: Patient Active Problem List   Diagnosis Date Noted  . AKI (acute kidney injury) (HCC) 10/31/2014  . Sepsis (HCC) 10/31/2014  . Bacteremia 10/30/2014  . UTI (lower urinary tract infection) 10/29/2014  . Acute renal failure superimposed on stage 3 chronic kidney disease (HCC) 10/29/2014  . Protein-calorie malnutrition, severe (HCC) 07/26/2014  . Physical deconditioning 07/25/2014  . Malnutrition (HCC) 07/25/2014  . Frequent PVCs   . Alzheimer's dementia (HCC) 06/13/2014  . CVA (cerebral vascular accident) (HCC) 02/13/2013  . HTN (hypertension) 02/13/2013  . Renal failure 02/13/2013  . Hypothyroidism 02/13/2013  . History of gout 02/13/2013   Past Medical History:  Diagnosis Date  . Dementia   . Gout   . Hypertension   . Hypothyroidism   . Stroke (HCC)   . Thyroid disease    hypothyroidism  . UTI (lower urinary tract infection)     Family History  Problem Relation Age of Onset  . Prostate cancer Brother     Past Surgical History:  Procedure Laterality Date  . COLON SURGERY    . EYE SURGERY     cateract surgery  . SHOULDER SURGERY    . TEE WITHOUT CARDIOVERSION N/A 02/16/2013   Procedure: TRANSESOPHAGEAL ECHOCARDIOGRAM (  TEE);  Surgeon: Wendall Stade, MD;  Location: [facility] ENDOSCOPY;  Service: Cardiovascular;  Laterality: N/A;   Social History   Occupational History  . Not on file  Tobacco Use  . Smoking status: Never Smoker  Substance and Sexual Activity  . Alcohol use: Yes    Comment: socially - none since 2010  . Drug use: No  . Sexual activity: Not on file

## 2018-03-25 NOTE — Telephone Encounter (Signed)
Advised patient's son Chrissie Noa) Rx was sent in to his pharmacy [facility]).

## 2018-03-25 NOTE — Telephone Encounter (Signed)
Liquid augmentin called in instead.

## 2018-03-25 NOTE — Progress Notes (Signed)
Depo Medrol 40 mg (1cc) given IM right dorsogluteal area per Dr. Lorane Gell, [facility])

## 2018-03-25 NOTE — Telephone Encounter (Signed)
Please advise 

## 2018-03-25 NOTE — Telephone Encounter (Signed)
Patients son called and said the medication patient was given this morning is too big for him to swallow and is making him choke. Please advise what he can or if there is something else he can be prescribed. # [phone]

## 2018-04-03 ENCOUNTER — Ambulatory Visit (INDEPENDENT_AMBULATORY_CARE_PROVIDER_SITE_OTHER): Payer: Medicare HMO

## 2018-04-03 ENCOUNTER — Ambulatory Visit (INDEPENDENT_AMBULATORY_CARE_PROVIDER_SITE_OTHER): Payer: Medicare HMO | Admitting: Family Medicine

## 2018-04-03 ENCOUNTER — Encounter (INDEPENDENT_AMBULATORY_CARE_PROVIDER_SITE_OTHER): Payer: Self-pay | Admitting: Family Medicine

## 2018-04-03 VITALS — BP 181/75 | HR 56 | Temp 97.7°F | Resp 20 | Ht 66.5 in | Wt 161.0 lb

## 2018-04-03 DIAGNOSIS — Q845 Enlarged and hypertrophic nails: Secondary | ICD-10-CM | POA: Diagnosis not present

## 2018-04-03 DIAGNOSIS — L989 Disorder of the skin and subcutaneous tissue, unspecified: Secondary | ICD-10-CM | POA: Diagnosis not present

## 2018-04-03 DIAGNOSIS — R05 Cough: Secondary | ICD-10-CM | POA: Diagnosis not present

## 2018-04-03 DIAGNOSIS — R059 Cough, unspecified: Secondary | ICD-10-CM

## 2018-04-03 MED ORDER — AMOXICILLIN-POT CLAVULANATE 400-57 MG/5ML PO SUSR: 800.0000 mg | Freq: Two times a day (BID) | ORAL | 1 refills | Status: AC

## 2018-04-03 NOTE — Progress Notes (Signed)
Office Visit Note   Patient: Donald Mcconnell           Date of Birth: [birth year]           MRN: [id number] Visit Date: 04/03/2018 Requested by: Burton Apley, MD [address], [address] [place], [place] [address] PCP: Burton Apley, MD  Subjective: Chief Complaint  Patient presents with  . 9 day f/u for pneumonia  . toenail trimming    HPI: He is here for follow-up probable right lung pneumonia.  He finished Augmentin.  His wife states that he is feeling better, but he says that he is not.              ROS: He has multiple skin lesions that he would like addressed.  Objective: Vital Signs: BP (!) 181/75 (BP Location: Left Arm, Patient Position: Sitting, Cuff Size: Normal)   Pulse (!) 56   Temp 97.7 F (36.5 C)   Resp 20   SpO2 98%   Physical Exam:  Lungs: He still has inspiratory crackles in the right lung base.  Otherwise good air movement with no wheezing. Heart: Regular rate and rhythm, no murmurs, rubs, or gallops. Skin: He has what appear to be a combination of actinic keratoses and seborrheic keratoses on the back of his hands and forearms.   Imaging: Chest x-ray: No definite pneumonia seen.  Assessment & Plan: 1.  Persistent cough with probable right lung pneumonia -We will try one more round of Augmentin.  If still not feeling any better, then we will order a CT scan.  2.  Multiple skin lesions -Dermatology referral  3.  Hypertrophic toenails -I will cut another visit.   Follow-Up Instructions: No follow-ups on file.       Procedures: None today.   PMFS History: Patient Active Problem List   Diagnosis Date Noted  . AKI (acute kidney injury) (HCC) 10/31/2014  . Sepsis (HCC) 10/31/2014  . Bacteremia 10/30/2014  . UTI (lower urinary tract infection) 10/29/2014  . Acute renal failure superimposed on stage 3 chronic kidney disease (HCC) 10/29/2014  . Protein-calorie malnutrition, severe (HCC) 07/26/2014  . Physical deconditioning 07/25/2014  .  Malnutrition (HCC) 07/25/2014  . Frequent PVCs   . Alzheimer's dementia (HCC) 06/13/2014  . CVA (cerebral vascular accident) (HCC) 02/13/2013  . HTN (hypertension) 02/13/2013  . Renal failure 02/13/2013  . Hypothyroidism 02/13/2013  . History of gout 02/13/2013   Past Medical History:  Diagnosis Date  . Dementia (HCC)   . Gout   . Hypertension   . Hypothyroidism   . Stroke (HCC)   . Thyroid disease    hypothyroidism  . UTI (lower urinary tract infection)     Family History  Problem Relation Age of Onset  . Prostate cancer Brother     Past Surgical History:  Procedure Laterality Date  . COLON SURGERY    . EYE SURGERY     cateract surgery  . SHOULDER SURGERY    . TEE WITHOUT CARDIOVERSION N/A 02/16/2013   Procedure: TRANSESOPHAGEAL ECHOCARDIOGRAM (TEE);  Surgeon: Wendall Stade, MD;  Location: [facility] ENDOSCOPY;  Service: Cardiovascular;  Laterality: N/A;   Social History   Occupational History  . Not on file  Tobacco Use  . Smoking status: Never Smoker  Substance and Sexual Activity  . Alcohol use: Yes    Comment: socially - none since 2010  . Drug use: No  . Sexual activity: Not on file

## 2018-04-15 ENCOUNTER — Ambulatory Visit (INDEPENDENT_AMBULATORY_CARE_PROVIDER_SITE_OTHER): Payer: Medicare HMO | Admitting: Family Medicine

## 2018-04-15 ENCOUNTER — Encounter (INDEPENDENT_AMBULATORY_CARE_PROVIDER_SITE_OTHER): Payer: Self-pay | Admitting: Family Medicine

## 2018-04-15 DIAGNOSIS — Q845 Enlarged and hypertrophic nails: Secondary | ICD-10-CM | POA: Diagnosis not present

## 2018-04-15 DIAGNOSIS — M25572 Pain in left ankle and joints of left foot: Secondary | ICD-10-CM | POA: Diagnosis not present

## 2018-04-15 DIAGNOSIS — M25571 Pain in right ankle and joints of right foot: Secondary | ICD-10-CM | POA: Diagnosis not present

## 2018-04-15 NOTE — Progress Notes (Signed)
   Office Visit Note   Patient: Donald Mcconnell           Date of Birth: December 01, 1928           MRN: [id number] Visit Date: 04/15/2018 Requested by: Burton Apleyoberts, Ronald, MD [address] [place], [place] [id number] PCP: Burton Apleyoberts, Ronald, MD  Subjective: Chief Complaint  Patient presents with  . nail trimming, bilateral feet    HPI: He is here for nail trimming both feet.  Long-standing problems with hypertrophic toenails.  Unable to safely cut them on his own.  They are so thick and big that they are painful when walking.  Incidentally he has finished antibiotics and his respiratory illness seems to be resolved.              ROS: Noncontributory  Objective: Vital Signs: There were no vitals taken for this visit.  Physical Exam:  Lungs are clear to auscultation today. Both feet have very thick and hypertrophic toenails x10.  These were carefully trimmed.  Imaging: None today.  Assessment & Plan: 1.  Hypertrophic toenails, unable to safely trim on his own -He will try Vicks VapoRub twice daily to the toenails to see if able improve the palpable onychomycosis. -If he does not get adequate relief from today's nail trimming, we will refer him to a podiatrist for possible nail removal for the most hypertrophic nails.   Follow-Up Instructions: Return if symptoms worsen or fail to improve.      Procedures: No procedures performed  No notes on file    PMFS History: Patient Active Problem List   Diagnosis Date Noted  . AKI (acute kidney injury) (HCC) 10/31/2014  . Sepsis (HCC) 10/31/2014  . Bacteremia 10/30/2014  . UTI (lower urinary tract infection) 10/29/2014  . Acute renal failure superimposed on stage 3 chronic kidney disease (HCC) 10/29/2014  . Protein-calorie malnutrition, severe (HCC) 07/26/2014  . Physical deconditioning 07/25/2014  . Malnutrition (HCC) 07/25/2014  . Frequent PVCs   . Alzheimer's dementia (HCC) 06/13/2014  . CVA (cerebral vascular accident) (HCC)  02/13/2013  . HTN (hypertension) 02/13/2013  . Renal failure 02/13/2013  . Hypothyroidism 02/13/2013  . History of gout 02/13/2013   Past Medical History:  Diagnosis Date  . Dementia (HCC)   . Gout   . Hypertension   . Hypothyroidism   . Stroke (HCC)   . Thyroid disease    hypothyroidism  . UTI (lower urinary tract infection)     Family History  Problem Relation Age of Onset  . Prostate cancer Brother     Past Surgical History:  Procedure Laterality Date  . COLON SURGERY    . EYE SURGERY     cateract surgery  . SHOULDER SURGERY    . TEE WITHOUT CARDIOVERSION N/A 02/16/2013   Procedure: TRANSESOPHAGEAL ECHOCARDIOGRAM (TEE);  Surgeon: Wendall StadePeter C Nishan, MD;  [facility];  Service: Cardiovascular;  Laterality: N/A;   Social History   Occupational History  . Not on file  Tobacco Use  . Smoking status: Never Smoker  Substance and Sexual Activity  . Alcohol use: Yes    Comment: socially - none since 2010  . Drug use: No  . Sexual activity: Not on file

## 2018-04-17 ENCOUNTER — Ambulatory Visit (INDEPENDENT_AMBULATORY_CARE_PROVIDER_SITE_OTHER): Payer: Self-pay | Admitting: Family Medicine

## 2018-05-03 DEATH — deceased
